# Patient Record
Sex: Female | Born: 1985 | Race: White | Hispanic: No | Marital: Married | Smoking: Never smoker
Health system: Southern US, Community
[De-identification: ages and names within clinical notes are randomized; demographics above are authoritative.]

## PROBLEM LIST (undated history)

## (undated) ENCOUNTER — Inpatient Hospital Stay (HOSPITAL_COMMUNITY): Payer: Self-pay

## (undated) DIAGNOSIS — D649 Anemia, unspecified: Secondary | ICD-10-CM

## (undated) HISTORY — PX: NO PAST SURGERIES: SHX2092

## (undated) HISTORY — PX: MYRINGOTOMY: SHX2060

---
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
## 2006-12-27 ENCOUNTER — Emergency Department (HOSPITAL_COMMUNITY): Admission: EM | Admit: 2006-12-27 | Discharge: 2006-12-27 | Payer: Self-pay | Admitting: Emergency Medicine

## 2007-01-15 ENCOUNTER — Emergency Department (HOSPITAL_COMMUNITY): Admission: EM | Admit: 2007-01-15 | Discharge: 2007-01-15 | Payer: Self-pay | Admitting: Family Medicine

## 2007-02-06 ENCOUNTER — Ambulatory Visit: Payer: Self-pay | Admitting: Internal Medicine

## 2007-02-10 ENCOUNTER — Ambulatory Visit (HOSPITAL_COMMUNITY): Admission: RE | Admit: 2007-02-10 | Discharge: 2007-02-10 | Payer: Self-pay | Admitting: Internal Medicine

## 2007-02-10 ENCOUNTER — Ambulatory Visit: Payer: Self-pay | Admitting: Internal Medicine

## 2007-02-18 ENCOUNTER — Emergency Department (HOSPITAL_COMMUNITY): Admission: EM | Admit: 2007-02-18 | Discharge: 2007-02-18 | Payer: Self-pay | Admitting: Emergency Medicine

## 2007-09-03 ENCOUNTER — Ambulatory Visit (HOSPITAL_COMMUNITY): Admission: RE | Admit: 2007-09-03 | Discharge: 2007-09-03 | Payer: Self-pay | Admitting: Obstetrics and Gynecology

## 2007-09-19 ENCOUNTER — Emergency Department (HOSPITAL_COMMUNITY): Admission: EM | Admit: 2007-09-19 | Discharge: 2007-09-20 | Payer: Self-pay | Admitting: Emergency Medicine

## 2010-04-09 ENCOUNTER — Emergency Department (HOSPITAL_COMMUNITY): Admission: EM | Admit: 2010-04-09 | Discharge: 2010-04-09 | Payer: Self-pay | Admitting: Family Medicine

## 2011-01-15 LAB — POCT URINALYSIS DIP (DEVICE)
Bilirubin Urine: NEGATIVE
Glucose, UA: NEGATIVE mg/dL
Hgb urine dipstick: NEGATIVE
Ketones, ur: NEGATIVE mg/dL
Nitrite: NEGATIVE
Protein, ur: NEGATIVE mg/dL
Specific Gravity, Urine: 1.01 (ref 1.005–1.030)
Urobilinogen, UA: 0.2 mg/dL (ref 0.0–1.0)
pH: 7 (ref 5.0–8.0)

## 2011-01-15 LAB — POCT PREGNANCY, URINE: Preg Test, Ur: NEGATIVE

## 2011-03-16 NOTE — Consult Note (Signed)
NAMEAERIKA, GROLL              ACCOUNT NO.:  [id number]   MEDICAL RECORD NO.:  [id number]          PATIENT TYPE:  AMB   LOCATION:  DAY                           FACILITY:  [facility]   PHYSICIAN:  Lionel December, M.D.    DATE OF BIRTH:  06-26-1986   DATE OF CONSULTATION:  02/06/2007  DATE OF DISCHARGE:                                 CONSULTATION   REFERRING PHYSICIAN:  Corrie Mckusick, M.D.   REASON FOR CONSULTATION:  Rectal bleeding.   HISTORY OF PRESENT ILLNESS:  [name] is a 25 year old, Caucasian  female.  Approximately 3 weeks ago, she developed an acute illness.  She  had nausea, vomiting and diarrhea which lasted about 3-1/2 hours.  She  then began to improve.  Approximately a week later, she began to have  bloody diarrhea.  She had anywhere between 5-10 loose, bloody stools.  She denies any mucus in her stools.  She has history of chronic  constipation, otherwise, and a significant amount of straining with her  stools.  She has been on MiraLax 17 g daily and has been doing much  better.  Prior to this, she would go 3-4 days without a bowel movement.  She has been given Anusol as well as ProctoFoam for her symptoms and she  has not noticed any further bleeding.  Lab work from Dr. Sharyon Medicus office  shows anemia with a hemoglobin of 10.4, hematocrit 35.8 and MCV of 70.1.  Back in March 2006, hemoglobin was up to 11 by May 2006, and she tells  me she denies any history of heavy menses and we do not have a recent  hemoglobin and hematocrit.   PAST MEDICAL HISTORY:  Bilateral myringotomy tubes.   CURRENT MEDICATIONS:  1. Yaz birth control pills.  2. Advil p.r.n. rarely.  3. MiraLax 17 g daily.   ALLERGIES:  NO KNOWN DRUG ALLERGIES.   FAMILY HISTORY:  No known family history of colorectal carcinoma, but  her father did develop adenomatous polyps in his late 70s.   SOCIAL HISTORY:  She is single.  She is a full-time business, Psychologist, prison and probation services at [employer].   She denies any tobacco, alcohol or  drug use.   REVIEW OF SYSTEMS:  CONSTITUTIONAL:  Weight is stable.  Denies any fever  or chills.  CARDIOVASCULAR:  Denies chest pain or palpitation, shortness  of breath, dyspnea, cough or hemoptysis.  GI:  See HPI.  Denies any  anorexia.  She has rare heartburn and indigestion.  Denies any dysphagia  or aphasia.  GYN:  Last menstrual period started today.  She has usual  28-day cycles.  Denies any menorrhagia.   PHYSICAL EXAMINATION:  VITAL SIGNS:  Weight 119 pounds, height 65  inches, temperature 97.7.  Blood pressure 100/78 and pulse 60.  GENERAL:  [name] is a well-developed, well-nourished, Caucasian  female in no acute distress.  She is accompanied by her mom today.  HEENT.  Sclerae clear.  Nonicteric and pink.  Oropharynx pink and moist  without any lesions.  NECK:  Supple without any mass or thyromegaly.  CHEST:  Regular rate and rhythm.  Normal S1, S2, without murmurs,  clicks, rubs or gallops.  LUNGS:  Clear to auscultation bilaterally.  ABDOMEN:  Positive bowel sounds x4.  No bruits auscultated.  Soft,  nontender, nondistended without palpable mass or hepatosplenomegaly.  No  rebound tenderness or guarding.  RECTAL:  No external lesions visualized.  Good sphincter tone  anteriorly.  She has an approximate 2 x 1-cm, raised palpable firm  lesion which is nontender.  A small amount of secretions were retained  obtained from the vault which were Hemoccult negative.  EXTREMITIES:  Without clubbing or edema bilaterally.  SKIN:  Pink, warm, dry without rash or jaundice.   IMPRESSION:  [name] is a 25 year old, Caucasian female with two  episodes of bloody diarrhea and abdominal cramping.  The first episode  was initially acute onset nausea, vomiting and diarrhea followed by  rectal bleeding.  Second episode was pretty much bloody diarrhea.  She  has history of iron deficiency anemia with last hemoglobin 11 in May  2006, per the  records we received.  On exam, she has a rectal lesion  which is going to require further evaluation with direct visualization.  She has a history of chronic constipation as well.  She is going to need  colonoscopy to rule out colorectal carcinoma or inflammatory bowel  disease.  It is possible she could have benign anorectal source for her  bleeding, but again rectal lesion is worrisome.   RECOMMENDATIONS:  1. Colonoscopy with Dr. Karilyn Cota in the near future.  I discussed the      procedure including risks and benefits including, but not limited      to bleeding, infection, perforation or drug reaction.  She agrees      and consent will be obtained.  2. Continue MiraLax 17 g daily for constipation.  3. Further recommendations to follow.   We would like to thank Dr. Phillips Odor for allowing [place] to participate in the  care of [name].      Nicholas Lose, N.P.      Lionel December, M.D.  Electronically Signed    KC/MEDQ  D:  02/06/2007  T:  02/06/2007  Job:  [id number]

## 2011-03-16 NOTE — Op Note (Signed)
Susan Lynn, Susan Lynn              ACCOUNT NO.:  [id number]   MEDICAL RECORD NO.:  [id number]          PATIENT TYPE:  AMB   LOCATION:  DAY                           FACILITY:  [facility]   PHYSICIAN:  Lionel December, M.D.    DATE OF BIRTH:  01/02/1986   DATE OF PROCEDURE:  02/10/2007  DATE OF DISCHARGE:                                PROCEDURE NOTE   PROCEDURE:  Colonoscopy with terminal ileoscopy.   ENDOSCOPIST:  Lionel December, M.D.   INDICATION:  Susan Lynn is a 25 year old Caucasian female who has chronic  constipation.  She recently suffered an episode of bloody diarrhea which  has cleared spontaneously.  She has mild iron-deficiency anemia felt to  be due to heavy periods; she has history of this.  She was seen in the  office last week by Ms. Nicholas Lose, F.N.P. and she felt a  lesion anteriorly.  She is undergoing diagnostic colonoscopy.  Procedure  risks were reviewed with the patient and informed consent was obtained.   MEDICATIONS FOR CONSCIOUS SEDATION:  Demerol 50 mg IV, Versed 8 mg IV in  divided dose.   FINDINGS:  Procedure performed in endoscopy suite.  The patient's vital  signs and O2 SATs were monitored during the procedure and remained  stable.  The patient was placed in the left lateral decubitus position  and rectal examination performed.  No abnormality noted on external or  digital exam, except her cervix was felt anteriorly, but there was no  mass.  It was felt easily through the anterior rectal wall, but there  was no mass.  Pentax videoscope was placed in the rectum.  Rectal mucosa  was normal.  Scope was retroflexed to examine anorectal junction, which  was unremarkable.  Endoscope was straightened and passed into sigmoid  colon and beyond.  There was somewhat redundant colon, but preparation  was excellent.  Scope was passed into cecum, which was identified by  appendiceal orifice and ileocecal valve.  A short segment of TI was also  examined and reveals  normal-appearing mucosa.  Pictures were taken for  the record.  As the scope was withdrawn, colonic mucosa was examined for  the second time and there were no mucosal abnormalities, polyps or  pseudomembranes noted.  Rectal mucosa was examined once again on the way  out and was normal.  The patient tolerated the procedure well.   FINAL DIAGNOSIS:  Normal colonoscopy and terminal ileoscopy.   I suspect she must have had a recent bout of self-limiting antral  colitis.   RECOMMENDATIONS:  She needs to be on a high-fiber diet plus fiber  supplement, 3-4 g daily.   Patient advised to take MiraLax daily or every other day and find that  loose stools that would allow her to have least three BMs a week.  The  patient was also advised to take ferrous sulfate 325 mg OTC 2 or 3 times  a week.  She should continue iron while she is having heavy periods.  She may have her H&H checked in the next few months when she follows up  with Dr.  Phillips Odor.      Lionel December, M.D.  Electronically Signed     NR/MEDQ  D:  02/10/2007  T:  02/11/2007  Job:  [id number]   cc:   Corrie Mckusick, M.D.  Fax: [phone]

## 2011-03-16 NOTE — Consult Note (Signed)
NAMEDAMIAH, MCDONALD              ACCOUNT NO.:  [id number]   MEDICAL RECORD NO.:  [id number]          PATIENT TYPE:  AMB   LOCATION:                                FACILITY:  [facility]   PHYSICIAN:  Lionel December, M.D.    DATE OF BIRTH:  [birth year]   DATE OF CONSULTATION:  02/07/2007  DATE OF DISCHARGE:                                 CONSULTATION   ADDENDUM   ADDENDUM:  Ms. Darrold Span was seen in the office yesterday. Just before 5  p.m., after her appointment yesterday, the nurse received a phone call  from her father stating that he had several questions.  I then contacted  Anamarie, the patient, and asked for her permission to discuss things with  her father.  She did give me verbal permission over the telephone.  I  then called her father who was in [place].  Basically, he stated that  her mother was quite distraught over the office visit today.  He said  that both she and Allysson are quite anxious.  He also tells me that she  felt as though I was throwing colon cancer up in her face (her  mother's).  Again I explained to him the fact that she does need  colonoscopy to further diagnose her symptoms.  I also explained to him  the limited utility of digital rectal exam in a situation like Kalis's  where she has had rectal bleeding, has a small lesion on exam, and has a  history of iron-deficiency anemia and has a family history of polyps.  I  stated it is not my intentions to cause any undue anxiety.  However, I  feel it is most appropriate for Ms. Livesay to undergo colonoscopy as  soon as possible to determine the etiology of her rectal bleeding.  Again, she had a small lesion palpated on digital rectal exam which  could be anything from a small internal hemorrhoid, polyp, or even  cancer but this is least likely.  Multiple questions were asked.  The  conversation lasted for about 15 minutes and I also stated that if the  patient herself or her mother have any further questions I would  be glad  to answer them.  She is scheduled for colonoscopy on Monday, which is  the next available time that Dr. Karilyn Cota is in the hospital.      Nicholas Lose, N.P.      Lionel December, M.D.  Electronically Signed    KC/MEDQ  D:  02/07/2007  T:  02/07/2007  Job:  [id number]

## 2011-04-02 ENCOUNTER — Inpatient Hospital Stay (INDEPENDENT_AMBULATORY_CARE_PROVIDER_SITE_OTHER)
Admission: RE | Admit: 2011-04-02 | Discharge: 2011-04-02 | Disposition: A | Discharge status: Home / Self Care | Payer: BC Managed Care – PPO | Source: Ambulatory Visit | Attending: Family Medicine | Admitting: Family Medicine

## 2011-04-02 DIAGNOSIS — H109 Unspecified conjunctivitis: Secondary | ICD-10-CM

## 2011-08-07 LAB — I-STAT 8, (EC8 V) (CONVERTED LAB)
BUN: 7
Bicarbonate: 20.4
Chloride: 111
pCO2, Ven: 33.1 — ABNORMAL LOW
pH, Ven: 7.399 — ABNORMAL HIGH

## 2011-08-07 LAB — POCT I-STAT CREATININE: Creatinine, Ser: 0.9

## 2011-08-07 LAB — RAPID URINE DRUG SCREEN, HOSP PERFORMED
Amphetamines: NOT DETECTED
Barbiturates: NOT DETECTED
Benzodiazepines: NOT DETECTED
Opiates: NOT DETECTED

## 2011-08-07 LAB — DIFFERENTIAL
Basophils Absolute: 0.1
Basophils Relative: 1
Eosinophils Absolute: 0 — ABNORMAL LOW
Monocytes Relative: 5
Neutrophils Relative %: 67

## 2011-08-07 LAB — CBC
MCHC: 34.1
MCV: 81.9
RBC: 5.08
RDW: 13

## 2014-05-26 LAB — OB RESULTS CONSOLE ABO/RH: RH Type: POSITIVE

## 2014-05-26 LAB — OB RESULTS CONSOLE RUBELLA ANTIBODY, IGM: RUBELLA: IMMUNE

## 2014-05-26 LAB — OB RESULTS CONSOLE GC/CHLAMYDIA
CHLAMYDIA, DNA PROBE: NEGATIVE
Gonorrhea: NEGATIVE

## 2014-05-26 LAB — OB RESULTS CONSOLE HEPATITIS B SURFACE ANTIGEN: Hepatitis B Surface Ag: NEGATIVE

## 2014-05-26 LAB — OB RESULTS CONSOLE RPR: RPR: NONREACTIVE

## 2014-05-26 LAB — OB RESULTS CONSOLE ANTIBODY SCREEN: ANTIBODY SCREEN: NEGATIVE

## 2014-05-26 LAB — OB RESULTS CONSOLE HIV ANTIBODY (ROUTINE TESTING): HIV: NONREACTIVE

## 2014-10-29 NOTE — L&D Delivery Note (Signed)
Delivery Note  SVD viable female Apgars 9,9 over 2nd deg ML lac.  Placenta delivered spontaneously intact with 3VC. Repair with 2-0 Chromic with good support and hemostasis noted and R/V exam confirms.  PH art was sent.  [place] cord blood was not done.  Mother and baby were doing well.  EBL 300cc  Candice Campavid Shanequia Kendrick, MD

## 2014-12-02 LAB — OB RESULTS CONSOLE GBS: STREP GROUP B AG: NEGATIVE

## 2014-12-13 ENCOUNTER — Encounter (HOSPITAL_COMMUNITY): Payer: Self-pay

## 2014-12-13 ENCOUNTER — Inpatient Hospital Stay (HOSPITAL_COMMUNITY)
Admission: AD | Admit: 2014-12-13 | Discharge: 2014-12-13 | Disposition: A | Discharge status: Home / Self Care | Payer: 59 | Source: Ambulatory Visit | Attending: Obstetrics and Gynecology | Admitting: Obstetrics and Gynecology

## 2014-12-13 DIAGNOSIS — O9989 Other specified diseases and conditions complicating pregnancy, childbirth and the puerperium: Secondary | ICD-10-CM | POA: Diagnosis present

## 2014-12-13 DIAGNOSIS — Z3A37 37 weeks gestation of pregnancy: Secondary | ICD-10-CM | POA: Diagnosis not present

## 2014-12-13 DIAGNOSIS — R109 Unspecified abdominal pain: Secondary | ICD-10-CM | POA: Diagnosis not present

## 2014-12-13 HISTORY — DX: Anemia, unspecified: D64.9

## 2014-12-13 LAB — AMNISURE RUPTURE OF MEMBRANE (ROM) NOT AT ARMC: AMNISURE: NEGATIVE

## 2014-12-13 NOTE — Discharge Instructions (Signed)

## 2014-12-13 NOTE — MAU Note (Addendum)
Pt C/O ? SROM, initial leakage of fluid last night about 2300.  Woke up @ 1100 with wet underwear, has continued to feel wet @ times during the night, is wet this a.m.  States she has some mild cramping, denies bleeding.  Last intercourse was last night prior to this leakage of fluid.  Pt reports good fetal movement.

## 2014-12-30 ENCOUNTER — Telehealth (HOSPITAL_COMMUNITY): Payer: Self-pay | Admitting: *Deleted

## 2014-12-30 NOTE — Telephone Encounter (Signed)
Preadmission screen  

## 2015-01-01 ENCOUNTER — Inpatient Hospital Stay (HOSPITAL_COMMUNITY): Payer: 59 | Admitting: Anesthesiology

## 2015-01-01 ENCOUNTER — Inpatient Hospital Stay (HOSPITAL_COMMUNITY)
Admission: RE | Admit: 2015-01-01 | Discharge: 2015-01-01 | Disposition: A | Discharge status: Home / Self Care | Payer: 59 | Source: Ambulatory Visit | Attending: Obstetrics and Gynecology | Admitting: Obstetrics and Gynecology

## 2015-01-01 ENCOUNTER — Encounter (HOSPITAL_COMMUNITY): Payer: Self-pay | Admitting: *Deleted

## 2015-01-01 ENCOUNTER — Inpatient Hospital Stay (HOSPITAL_COMMUNITY)
Admission: AD | Admit: 2015-01-01 | Discharge: 2015-01-03 | DRG: 775 | Disposition: A | Discharge status: Home / Self Care | Payer: 59 | Source: Ambulatory Visit | Attending: Obstetrics and Gynecology | Admitting: Obstetrics and Gynecology

## 2015-01-01 DIAGNOSIS — Z3490 Encounter for supervision of normal pregnancy, unspecified, unspecified trimester: Secondary | ICD-10-CM

## 2015-01-01 DIAGNOSIS — Z3A39 39 weeks gestation of pregnancy: Secondary | ICD-10-CM | POA: Diagnosis present

## 2015-01-01 LAB — TYPE AND SCREEN
ABO/RH(D): A POS
ANTIBODY SCREEN: NEGATIVE

## 2015-01-01 LAB — ABO/RH: ABO/RH(D): A POS

## 2015-01-01 LAB — CBC
HCT: 39.2 % (ref 36.0–46.0)
Hemoglobin: 13.3 g/dL (ref 12.0–15.0)
MCH: 30 pg (ref 26.0–34.0)
MCHC: 33.9 g/dL (ref 30.0–36.0)
MCV: 88.3 fL (ref 78.0–100.0)
PLATELETS: 123 10*3/uL — AB (ref 150–400)
RBC: 4.44 MIL/uL (ref 3.87–5.11)
RDW: 14.2 % (ref 11.5–15.5)
WBC: 9.5 10*3/uL (ref 4.0–10.5)

## 2015-01-01 MED ORDER — OXYTOCIN 40 UNITS IN LACTATED RINGERS INFUSION - SIMPLE MED: 62.5000 mL/h | INTRAVENOUS | Status: DC

## 2015-01-01 MED ORDER — PHENYLEPHRINE 40 MCG/ML (10ML) SYRINGE FOR IV PUSH (FOR BLOOD PRESSURE SUPPORT)
80.0000 ug | PREFILLED_SYRINGE | INTRAVENOUS | Status: DC | PRN
  Filled 2015-01-01: qty 2

## 2015-01-01 MED ORDER — EPHEDRINE 5 MG/ML INJ
10.0000 mg | INTRAVENOUS | Status: DC | PRN
  Filled 2015-01-01: qty 2

## 2015-01-01 MED ORDER — ZOLPIDEM TARTRATE 5 MG PO TABS: 5.0000 mg | ORAL_TABLET | Freq: Every evening | ORAL | Status: DC | PRN

## 2015-01-01 MED ORDER — ONDANSETRON HCL 4 MG/2ML IJ SOLN: 4.0000 mg | Freq: Four times a day (QID) | INTRAMUSCULAR | Status: DC | PRN

## 2015-01-01 MED ORDER — BENZOCAINE-MENTHOL 20-0.5 % EX AERO
1.0000 "application " | INHALATION_SPRAY | CUTANEOUS | Status: DC | PRN
  Administered 2015-01-01: 1 via TOPICAL
  Filled 2015-01-01: qty 56

## 2015-01-01 MED ORDER — SALINE SPRAY 0.65 % NA SOLN
1.0000 | NASAL | Status: DC | PRN
  Filled 2015-01-01: qty 44

## 2015-01-01 MED ORDER — TETANUS-DIPHTH-ACELL PERTUSSIS 5-2.5-18.5 LF-MCG/0.5 IM SUSP: 0.5000 mL | Freq: Once | INTRAMUSCULAR | Status: DC

## 2015-01-01 MED ORDER — FENTANYL 2.5 MCG/ML BUPIVACAINE 1/10 % EPIDURAL INFUSION (WH - ANES)
INTRAMUSCULAR | Status: AC
  Filled 2015-01-01: qty 125

## 2015-01-01 MED ORDER — OXYCODONE-ACETAMINOPHEN 5-325 MG PO TABS: 2.0000 | ORAL_TABLET | ORAL | Status: DC | PRN

## 2015-01-01 MED ORDER — DIBUCAINE 1 % RE OINT: 1.0000 "application " | TOPICAL_OINTMENT | RECTAL | Status: DC | PRN

## 2015-01-01 MED ORDER — MEDROXYPROGESTERONE ACETATE 150 MG/ML IM SUSP: 150.0000 mg | INTRAMUSCULAR | Status: DC | PRN

## 2015-01-01 MED ORDER — OXYMETAZOLINE HCL 0.05 % NA SOLN
1.0000 | Freq: Two times a day (BID) | NASAL | Status: DC
  Filled 2015-01-01: qty 15

## 2015-01-01 MED ORDER — FENTANYL 2.5 MCG/ML BUPIVACAINE 1/10 % EPIDURAL INFUSION (WH - ANES)
14.0000 mL/h | INTRAMUSCULAR | Status: DC | PRN
  Administered 2015-01-01: 14 mL/h via EPIDURAL

## 2015-01-01 MED ORDER — CITRIC ACID-SODIUM CITRATE 334-500 MG/5ML PO SOLN: 30.0000 mL | ORAL | Status: DC | PRN

## 2015-01-01 MED ORDER — WITCH HAZEL-GLYCERIN EX PADS: 1.0000 "application " | MEDICATED_PAD | CUTANEOUS | Status: DC | PRN

## 2015-01-01 MED ORDER — LIDOCAINE HCL (PF) 1 % IJ SOLN
30.0000 mL | INTRAMUSCULAR | Status: DC | PRN
  Filled 2015-01-01: qty 30

## 2015-01-01 MED ORDER — OXYCODONE-ACETAMINOPHEN 5-325 MG PO TABS: 1.0000 | ORAL_TABLET | ORAL | Status: DC | PRN

## 2015-01-01 MED ORDER — PRENATAL MULTIVITAMIN CH
1.0000 | ORAL_TABLET | Freq: Every day | ORAL | Status: DC
  Filled 2015-01-01 (×2): qty 1

## 2015-01-01 MED ORDER — FENTANYL 2.5 MCG/ML BUPIVACAINE 1/10 % EPIDURAL INFUSION (WH - ANES): 12.0000 mL/h | INTRAMUSCULAR | Status: DC | PRN

## 2015-01-01 MED ORDER — LIDOCAINE HCL (PF) 1 % IJ SOLN
30.0000 mL | INTRAMUSCULAR | Status: AC | PRN
  Administered 2015-01-01 (×2): 5 mL via SUBCUTANEOUS

## 2015-01-01 MED ORDER — OXYTOCIN 40 UNITS IN LACTATED RINGERS INFUSION - SIMPLE MED
1.0000 m[IU]/min | INTRAVENOUS | Status: DC
  Administered 2015-01-01: 2 m[IU]/min via INTRAVENOUS
  Filled 2015-01-01: qty 1000

## 2015-01-01 MED ORDER — PHENYLEPHRINE 40 MCG/ML (10ML) SYRINGE FOR IV PUSH (FOR BLOOD PRESSURE SUPPORT)
PREFILLED_SYRINGE | INTRAVENOUS | Status: AC
  Filled 2015-01-01: qty 20

## 2015-01-01 MED ORDER — MEASLES, MUMPS & RUBELLA VAC ~~LOC~~ INJ
0.5000 mL | INJECTION | Freq: Once | SUBCUTANEOUS | Status: DC
  Filled 2015-01-01: qty 0.5

## 2015-01-01 MED ORDER — OXYTOCIN BOLUS FROM INFUSION
500.0000 mL | INTRAVENOUS | Status: DC
  Administered 2015-01-01: 500 mL via INTRAVENOUS

## 2015-01-01 MED ORDER — LACTATED RINGERS IV SOLN
INTRAVENOUS | Status: DC
Start: 2015-01-01 — End: 2015-01-01
  Administered 2015-01-01: 1000 mL via INTRAVENOUS
  Administered 2015-01-01: 15:00:00 via INTRAVENOUS

## 2015-01-01 MED ORDER — IBUPROFEN 600 MG PO TABS
600.0000 mg | ORAL_TABLET | Freq: Four times a day (QID) | ORAL | Status: DC
  Administered 2015-01-02 – 2015-01-03 (×7): 600 mg via ORAL
  Filled 2015-01-01 (×7): qty 1

## 2015-01-01 MED ORDER — DIPHENHYDRAMINE HCL 50 MG/ML IJ SOLN: 12.5000 mg | INTRAMUSCULAR | Status: DC | PRN

## 2015-01-01 MED ORDER — TERBUTALINE SULFATE 1 MG/ML IJ SOLN
0.2500 mg | Freq: Once | INTRAMUSCULAR | Status: DC | PRN
  Filled 2015-01-01: qty 1

## 2015-01-01 MED ORDER — SIMETHICONE 80 MG PO CHEW: 80.0000 mg | CHEWABLE_TABLET | ORAL | Status: DC | PRN

## 2015-01-01 MED ORDER — LACTATED RINGERS IV SOLN: 500.0000 mL | INTRAVENOUS | Status: DC | PRN

## 2015-01-01 MED ORDER — DIPHENHYDRAMINE HCL 25 MG PO CAPS: 25.0000 mg | ORAL_CAPSULE | Freq: Four times a day (QID) | ORAL | Status: DC | PRN

## 2015-01-01 MED ORDER — LANOLIN HYDROUS EX OINT: TOPICAL_OINTMENT | CUTANEOUS | Status: DC | PRN

## 2015-01-01 MED ORDER — LIDOCAINE HCL (PF) 1 % IJ SOLN
INTRAMUSCULAR | Status: AC
  Filled 2015-01-01: qty 30

## 2015-01-01 MED ORDER — ONDANSETRON HCL 4 MG/2ML IJ SOLN: 4.0000 mg | INTRAMUSCULAR | Status: DC | PRN

## 2015-01-01 MED ORDER — LACTATED RINGERS IV SOLN
500.0000 mL | Freq: Once | INTRAVENOUS | Status: AC
  Administered 2015-01-01: 500 mL via INTRAVENOUS

## 2015-01-01 MED ORDER — FLEET ENEMA 7-19 GM/118ML RE ENEM: 1.0000 | ENEMA | RECTAL | Status: DC | PRN

## 2015-01-01 MED ORDER — ACETAMINOPHEN 325 MG PO TABS: 650.0000 mg | ORAL_TABLET | ORAL | Status: DC | PRN

## 2015-01-01 MED ORDER — ONDANSETRON HCL 4 MG PO TABS: 4.0000 mg | ORAL_TABLET | ORAL | Status: DC | PRN

## 2015-01-01 MED ORDER — SENNOSIDES-DOCUSATE SODIUM 8.6-50 MG PO TABS
2.0000 | ORAL_TABLET | ORAL | Status: DC
  Administered 2015-01-02 – 2015-01-03 (×2): 2 via ORAL
  Filled 2015-01-01 (×2): qty 2

## 2015-01-01 NOTE — Progress Notes (Signed)
efm d/c'd for epidural  

## 2015-01-01 NOTE — Lactation Note (Signed)
This note was copied from the chart of Susan Percell LocusAshlee Sinkfield. Lactation Consultation Note Initial visit at 4 hours of age per request of MBU RN, pt needs latch assist.  Mom reports baby has had a few latches but with only a few sucks and some have been shallow with nipple pain.  Assisted with position for cross cradle.  Mom is able to easily express copious colostrum.  Baby opens wide and has a very strong initial suction and then stops and pulls off fussy.  Repeated several times and then baby began a rhythmic sucking with wide flanged lips.  Mom has short nipples and dense breast tissue compressible with effort.  FOB at bedside supportive.  Baby noted to have several swallows during a few minutes of feeding.  Baby remains latched [facility] visit. [facility] given and discussed.  Encouraged to feed with early cues on demand.  Early newborn behavior discussed.  Mom to call for assist as needed.    Patient Name: Susan Lynn JXBJY'NToday's Date: 01/01/2015 Reason for consult: Initial assessment   Maternal Data Formula Feeding for Exclusion: No Has patient been taught Hand Expression?: Yes Does the patient have breastfeeding experience prior to this delivery?: No  Feeding Feeding Type: Breast Fed Length of feed:  (several minutes observed)  LATCH Score/Interventions Latch: Repeated attempts needed to sustain latch, nipple held in mouth throughout feeding, stimulation needed to elicit sucking reflex. Intervention(s): Skin to skin;Teach feeding cues;Waking techniques Intervention(s): Breast compression;Breast massage  Audible Swallowing: A few with stimulation Intervention(s): Skin to skin;Hand expression  Type of Nipple: Everted at rest and after stimulation  Comfort (Breast/Nipple): Soft / non-tender     Hold (Positioning): Assistance needed to correctly position infant at breast and maintain latch. Intervention(s): Skin to skin;Position options;Support Pillows;Breastfeeding basics  reviewed  LATCH Score: 7  Lactation Tools Discussed/Used     Consult Status Consult Status: Follow-up Date: 01/02/15 Follow-up type: In-patient    [name], [name] 01/01/2015, 11:20 PM

## 2015-01-01 NOTE — Anesthesia Procedure Notes (Signed)
Epidural Patient location during procedure: OB Start time: 01/01/2015 2:40 PM End time: 01/01/2015 3:58 PM  Staffing Anesthesiologist: Rosario JacksPOTISEK, Vermelle Cammarata GUFFEY Performed by: anesthesiologist   Preanesthetic Checklist Completed: patient identified, site marked, surgical consent, pre-op evaluation, timeout performed, IV checked, risks and benefits discussed and monitors and equipment checked  Epidural Patient position: sitting Prep: site prepped and draped and DuraPrep Patient monitoring: continuous pulse ox and blood pressure Approach: midline Location: L3-L4 Injection technique: LOR air  Needle:  Needle type: Tuohy  Needle gauge: 17 G Needle length: 9 cm and 9 Needle insertion depth: 5 cm cm Catheter type: closed end flexible Catheter size: 19 Gauge Catheter at skin depth: 10 cm Test dose: negative  Assessment Events: blood not aspirated, injection not painful, no injection resistance, negative IV test and no paresthesia  Additional Notes Informed consent obtained prior to proceeding including risk of failure, 1% risk of PDPH, risk of minor discomfort and bruising.  Discussed rare but serious complications including epidural abscess, permanent nerve injury, epidural hematoma.  Discussed alternatives to epidural analgesia and patient desires to proceed.  Timeout performed pre-procedure verifying patient name, procedure, and platelet count.  Patient tolerated procedure well.  Catheter secured at 10 at the skin.  SA test negative as confirmed by no motor block of hip abduction at 5 min post injection of 50 mg of lidocaine into the epidural catheter.  Bupivacaine 0.1% with fentanyl 2.75mcg/ml infused post procedure at 2712ml/hr with PCEA of 9ml every 10 mins.

## 2015-01-01 NOTE — H&P (Signed)
Susan Lynn is a 29 y.o. female presenting for IOL due to social reasons (husband to be out of town due to business) and favorable cervix.  Pregnancy uncomplicated.  GBS-. History OB History    Gravida Para Term Preterm AB TAB SAB Ectopic Multiple Living   1         0     Past Medical History  Diagnosis Date  . Anemia    Past Surgical History  Procedure Laterality Date  . No past surgeries     Family History: family history is not on file. Social History:  reports that she has never smoked. She does not have any smokeless tobacco history on file. She reports that she does not drink alcohol or use illicit drugs.   Prenatal Transfer Tool  Maternal Diabetes: No Genetic Screening: Normal Maternal Ultrasounds/Referrals: Normal Fetal Ultrasounds or other Referrals:  None Maternal Substance Abuse:  No Significant Maternal Medications:  None Significant Maternal Lab Results:  None Other Comments:  None  ROS  Dilation: 2.5 Effacement (%): 80 Station: -2 Exam by:: Dr Rana SnareLowe Blood pressure 136/72, pulse 92, height 5\' 5"  (1.651 m), weight 159 lb (72.122 kg). Exam Physical Exam  Prenatal labs: ABO, Rh: A/Positive/-- (07/29 0000) Antibody: Negative (07/29 0000) Rubella: Immune (07/29 0000) RPR: Nonreactive (07/29 0000)  HBsAg: Negative (07/29 0000)  HIV: Non-reactive (07/29 0000)  GBS: Negative (02/04 0000)   Assessment/Plan: IUP at term Favorable cervix.  AROM and pitocin Anticipate SVD   Susan Lynn 01/01/2015, 8:44 AM

## 2015-01-01 NOTE — Anesthesia Preprocedure Evaluation (Addendum)
Anesthesia Evaluation  Patient identified by MRN, date of birth, ID band Patient awake    Reviewed: Allergy & Precautions, NPO status , Patient's Chart, lab work & pertinent test results  History of Anesthesia Complications Negative for: history of anesthetic complications  Airway Mallampati: II  TM Distance: >3 FB Neck ROM: Full    Dental no notable dental hx.    Pulmonary neg pulmonary ROS, neg sleep apnea,  breath sounds clear to auscultation  Pulmonary exam normal       Cardiovascular negative cardio ROS  Rhythm:Regular Rate:Normal     Neuro/Psych negative neurological ROS  negative psych ROS   GI/Hepatic negative GI ROS, Neg liver ROS, GERD-  Medicated and Controlled,  Endo/Other  negative endocrine ROS  Renal/GU negative Renal ROS  negative genitourinary   Musculoskeletal negative musculoskeletal ROS (+)   Abdominal Normal abdominal exam  (+)   Peds negative pediatric ROS (+)  Hematology negative hematology ROS (+)   Anesthesia Other Findings   Reproductive/Obstetrics negative OB ROS                            Anesthesia Physical Anesthesia Plan  ASA: II  Anesthesia Plan: Epidural   Post-op Pain Management:    Induction:   Airway Management Planned:   Additional Equipment:   Intra-op Plan:   Post-operative Plan:   Informed Consent:   Plan Discussed with:   Anesthesia Plan Comments: [age] yr old G1 at 3239 weeks and 2 days for EIOL. Desires epidural.  Platelets 125 today.  NO history of abnormal bleeding.  Does have vague history of local anesthetics "not working."    )       Anesthesia Quick Evaluation

## 2015-01-02 LAB — CBC
HCT: 36.1 % (ref 36.0–46.0)
Hemoglobin: 12.2 g/dL (ref 12.0–15.0)
MCH: 29.8 pg (ref 26.0–34.0)
MCHC: 33.8 g/dL (ref 30.0–36.0)
MCV: 88.3 fL (ref 78.0–100.0)
Platelets: 132 10*3/uL — ABNORMAL LOW (ref 150–400)
RBC: 4.09 MIL/uL (ref 3.87–5.11)
RDW: 14.1 % (ref 11.5–15.5)
WBC: 13.5 10*3/uL — ABNORMAL HIGH (ref 4.0–10.5)

## 2015-01-02 LAB — RPR: RPR: NONREACTIVE

## 2015-01-02 LAB — HIV ANTIBODY (ROUTINE TESTING W REFLEX): HIV Screen 4th Generation wRfx: NONREACTIVE

## 2015-01-02 NOTE — Anesthesia Postprocedure Evaluation (Signed)
  Anesthesia Post-op Note  Patient: Susan Lynn  Procedure(s) Performed: * No procedures listed *  Patient Location: Mother/Baby  Anesthesia Type:Epidural  Level of Consciousness: awake and alert   Airway and Oxygen Therapy: Patient Spontanous Breathing  Post-op Pain: mild  Post-op Assessment: Post-op Vital signs reviewed, Patient's Cardiovascular Status Stable, Respiratory Function Stable, No signs of Nausea or vomiting, Pain level controlled, No headache, No residual numbness and No residual motor weakness  Post-op Vital Signs: Reviewed  Last Vitals:  Filed Vitals:   01/02/15 0200  BP: 125/52  Pulse: 67  Temp: 36.8 C  Resp: 18    Complications: No apparent anesthesia complications

## 2015-01-02 NOTE — Progress Notes (Signed)
Post Partum Day 1 Subjective: no complaints, up ad lib, voiding and tolerating PO  Objective: Blood pressure 125/52, pulse 67, temperature 98.2 F (36.8 C), temperature source Oral, resp. rate 18, height 5\' 5"  (1.651 m), weight 159 lb (72.122 kg), SpO2 98 %, unknown if currently breastfeeding.  Physical Exam:  General: alert, cooperative, appears stated age and no distress Lochia: appropriate Uterine Fundus: firm Incision: healing well DVT Evaluation: No evidence of DVT seen on physical exam.   Recent Labs  01/01/15 0740 01/02/15 0611  HGB 13.3 12.2  HCT 39.2 36.1    Assessment/Plan: Plan for discharge tomorrow and Breastfeeding   LOS: 1 day   Arsh Feutz C 01/02/2015, 9:44 AM

## 2015-01-02 NOTE — Progress Notes (Signed)

## 2015-01-02 NOTE — Lactation Note (Signed)
This note was copied from the chart of Susan Percell LocusAshlee Popwell. Lactation Consultation Note  Patient Name: Susan Lynn WUJWJ'XToday's Date: 01/02/2015 Reason for consult: Follow-up assessment;Breast/nipple pain Mom's left nipple is bruised/aerola is bruised, right nipple is cracked/bruised. Attempted to assist Mom with latching baby, severe to moderate discomfort reported. Baby had difficulty sustaining good depth with latch. Initiated #24 nipple shield, baby latched well developing wide flanged lips, good suckling rhythm with some audible swallows. Mom reports much less discomfort. Care for sore nipples reviewed. Mom has comfort gels. Mom has own DEBP and advised to post pump for 15 minutes after feeding 4-6 times per day starting tomorrow to encourage milk production. Advised to look for colostrum in the nipple shield with feedings, scant amount observed with #24 nipple shield, Mom will try #20 with next feeding. Advised baby should be at the breast 8-12 times in 24 hours, cluster feeding reviewed. Encouraged to call for assist as needed.   Maternal Data    Feeding Feeding Type: Breast Fed  LATCH Score/Interventions Latch: Grasps breast easily, tongue down, lips flanged, rhythmical sucking. (using #24 nipple shield) Intervention(s): Adjust position;Assist with latch;Breast massage;Breast compression  Audible Swallowing: A few with stimulation  Type of Nipple: Everted at rest and after stimulation (nipples have short shaft, aerola edema)  Comfort (Breast/Nipple): Filling, red/small blisters or bruises, mild/mod discomfort  Problem noted: Cracked, bleeding, blisters, bruises;Mild/Moderate discomfort Interventions  (Cracked/bleeding/bruising/blister): Expressed breast milk to nipple Interventions (Mild/moderate discomfort): Comfort gels  Hold (Positioning): Assistance needed to correctly position infant at breast and maintain latch. Intervention(s): Breastfeeding basics reviewed;Support  Pillows;Position options;Skin to skin  LATCH Score: 7  Lactation Tools Discussed/Used Tools: Nipple Shields;Comfort gels Nipple shield size: 24   Consult Status Consult Status: Follow-up Date: 01/03/15 Follow-up type: In-patient    Susan Lynn, Susan Lynn 01/02/2015, 9:43 PM

## 2015-01-03 NOTE — Discharge Summary (Signed)
Obstetric Discharge Summary Reason for Admission: induction of labor Prenatal Procedures: ultrasound Intrapartum Procedures: spontaneous vaginal delivery Postpartum Procedures: none Complications-Operative and Postpartum: 2 degree perineal laceration HEMOGLOBIN  Date Value Ref Range Status  01/02/2015 12.2 12.0 - 15.0 g/dL Final   HCT  Date Value Ref Range Status  01/02/2015 36.1 36.0 - 46.0 % Final    Physical Exam:  General: alert and cooperative Lochia: appropriate Uterine Fundus: firm Incision: healing well DVT Evaluation: No evidence of DVT seen on physical exam. Negative Homan's sign. No cords or calf tenderness. No significant calf/ankle edema.  Discharge Diagnoses: Term Pregnancy-delivered  Discharge Information: Date: 01/03/2015 Activity: pelvic rest Diet: routine Medications: PNV and Ibuprofen Condition: stable Instructions: refer to practice specific booklet Discharge to: home   Newborn Data: Live born female  Birth Weight: 7 lb 11.1 oz (3490 g) APGAR: 9, 9  Home with mother.  CURTIS,CAROL G 01/03/2015, 8:27 AM

## 2015-01-03 NOTE — Lactation Note (Addendum)
This note was copied from the chart of Susan Percell LocusAshlee Vanvorst. Lactation Consultation Note  Mom is feeling more comfortable with feedings.  Extra comfort gels given to mom related to outpatient appointment being in one week. Breasts are filling.  [name] is using the nipple shield to latch onto and mom reports it is better.  She needs occasional stimulation to continue suckling.  Recommended breast compression through out the feedings.  Post pumping planned.  A Foley cup was given to the mom and its use explained in the event that the baby needs supplementationOutpatient appointment scheduled.  Aware of support groups and outpatient services.  Patient Name: Susan Lynn BJYNW'GToday's Date: 01/03/2015     Maternal Data    Feeding Feeding Type: Breast Fed Length of feed: 20 min  LATCH Score/Interventions Latch: Grasps breast easily, tongue down, lips flanged, rhythmical sucking. Intervention(s): Skin to skin  Audible Swallowing: A few with stimulation  Type of Nipple: Everted at rest and after stimulation  Comfort (Breast/Nipple): Filling, red/small blisters or bruises, mild/mod discomfort  Problem noted: Cracked, bleeding, blisters, bruises Interventions  (Cracked/bleeding/bruising/blister):  (comfort gels, hand expressed breastmilk ro nipples) Interventions (Mild/moderate discomfort): Comfort gels  Hold (Positioning): Assistance needed to correctly position infant at breast and maintain latch. Intervention(s): Support Pillows  LATCH Score: 7  Lactation Tools Discussed/Used     Consult Status      Susan Lynn, Susan Lynn 01/03/2015, 9:10 AM

## 2015-01-10 ENCOUNTER — Ambulatory Visit (HOSPITAL_COMMUNITY): Admit: 2015-01-10 | Payer: 59

## 2016-10-04 ENCOUNTER — Other Ambulatory Visit: Payer: Self-pay | Admitting: Obstetrics and Gynecology

## 2016-10-04 DIAGNOSIS — N644 Mastodynia: Secondary | ICD-10-CM

## 2016-10-17 ENCOUNTER — Ambulatory Visit
Admission: RE | Admit: 2016-10-17 | Discharge: 2016-10-17 | Disposition: A | Discharge status: Home / Self Care | Payer: BLUE CROSS/BLUE SHIELD | Source: Ambulatory Visit | Attending: Obstetrics and Gynecology | Admitting: Obstetrics and Gynecology

## 2016-10-17 DIAGNOSIS — N644 Mastodynia: Secondary | ICD-10-CM

## 2016-11-30 ENCOUNTER — Encounter: Payer: Self-pay | Admitting: Internal Medicine

## 2017-05-27 LAB — OB RESULTS CONSOLE RUBELLA ANTIBODY, IGM: Rubella: IMMUNE

## 2017-05-27 LAB — OB RESULTS CONSOLE ABO/RH: RH Type: POSITIVE

## 2017-05-27 LAB — OB RESULTS CONSOLE GC/CHLAMYDIA
Chlamydia: NEGATIVE
GC PROBE AMP, GENITAL: NEGATIVE

## 2017-05-27 LAB — OB RESULTS CONSOLE RPR: RPR: NONREACTIVE

## 2017-05-27 LAB — OB RESULTS CONSOLE HEPATITIS B SURFACE ANTIGEN: Hepatitis B Surface Ag: NEGATIVE

## 2017-05-27 LAB — OB RESULTS CONSOLE HIV ANTIBODY (ROUTINE TESTING): HIV: NONREACTIVE

## 2017-05-27 LAB — OB RESULTS CONSOLE ANTIBODY SCREEN: Antibody Screen: NEGATIVE

## 2017-10-29 NOTE — L&D Delivery Note (Signed)
Operative Delivery Note At 5:24 AM a viable female was delivered via Vaginal, Vacuum Investment banker, operational(Extractor).  Presentation: vertex; Position: ROT; Station: +3.  Verbal consent: obtained from patient.  Risks and benefits discussed in detail.  Risks include, but are not limited to the risks of anesthesia, bleeding, infection, damage to maternal tissues, fetal cephalhematoma.  There is also the risk of inability to effect vaginal delivery of the head, or shoulder dystocia that cannot be resolved by established maneuvers, leading to the need for emergency cesarean section.  APGAR: 8, 9; weight  .   Placenta status: spontaneously with 3 vessel cord , .   Cord:  with the following complications:none .  Cord pH: not obtained  Anesthesia:  epidural Instruments: mushroom Episiotomy: None Lacerations:  2nd Suture Repair: 2.0 3.0 chromic vicryl Est. Blood Loss (mL):  300  Mom to postpartum.  Baby to Couplet care / Skin to Skin.  Ahana Najera L 01/07/2018, 5:41 AM

## 2017-12-02 LAB — OB RESULTS CONSOLE GBS: GBS: NEGATIVE

## 2018-01-01 ENCOUNTER — Encounter (HOSPITAL_COMMUNITY): Payer: Self-pay | Admitting: *Deleted

## 2018-01-01 ENCOUNTER — Telehealth (HOSPITAL_COMMUNITY): Payer: Self-pay | Admitting: *Deleted

## 2018-01-01 NOTE — Telephone Encounter (Signed)
Preadmission screen  

## 2018-01-06 ENCOUNTER — Inpatient Hospital Stay (HOSPITAL_COMMUNITY)
Admission: AD | Admit: 2018-01-06 | Discharge: 2018-01-08 | DRG: 807 | Disposition: A | Discharge status: Home / Self Care | Payer: BLUE CROSS/BLUE SHIELD | Source: Ambulatory Visit | Attending: Obstetrics and Gynecology | Admitting: Obstetrics and Gynecology

## 2018-01-06 ENCOUNTER — Encounter (HOSPITAL_COMMUNITY): Payer: Self-pay

## 2018-01-06 DIAGNOSIS — Z3A40 40 weeks gestation of pregnancy: Secondary | ICD-10-CM | POA: Diagnosis not present

## 2018-01-06 DIAGNOSIS — Z3483 Encounter for supervision of other normal pregnancy, third trimester: Secondary | ICD-10-CM | POA: Diagnosis present

## 2018-01-06 LAB — CBC
HEMATOCRIT: 40.4 % (ref 36.0–46.0)
HEMOGLOBIN: 13.9 g/dL (ref 12.0–15.0)
MCH: 30.7 pg (ref 26.0–34.0)
MCHC: 34.4 g/dL (ref 30.0–36.0)
MCV: 89.2 fL (ref 78.0–100.0)
Platelets: 128 10*3/uL — ABNORMAL LOW (ref 150–400)
RBC: 4.53 MIL/uL (ref 3.87–5.11)
RDW: 14.3 % (ref 11.5–15.5)
WBC: 10.1 10*3/uL (ref 4.0–10.5)

## 2018-01-06 LAB — TYPE AND SCREEN
ABO/RH(D): A POS
ANTIBODY SCREEN: NEGATIVE

## 2018-01-06 MED ORDER — LACTATED RINGERS IV SOLN: 500.0000 mL | INTRAVENOUS | Status: DC | PRN

## 2018-01-06 MED ORDER — LACTATED RINGERS IV SOLN
INTRAVENOUS | Status: DC
  Administered 2018-01-06: 23:00:00 via INTRAVENOUS

## 2018-01-06 MED ORDER — FLEET ENEMA 7-19 GM/118ML RE ENEM: 1.0000 | ENEMA | RECTAL | Status: DC | PRN

## 2018-01-06 MED ORDER — OXYCODONE-ACETAMINOPHEN 5-325 MG PO TABS: 2.0000 | ORAL_TABLET | ORAL | Status: DC | PRN

## 2018-01-06 MED ORDER — ONDANSETRON HCL 4 MG/2ML IJ SOLN: 4.0000 mg | Freq: Four times a day (QID) | INTRAMUSCULAR | Status: DC | PRN

## 2018-01-06 MED ORDER — OXYTOCIN BOLUS FROM INFUSION
500.0000 mL | Freq: Once | INTRAVENOUS | Status: AC
  Administered 2018-01-07: 500 mL via INTRAVENOUS

## 2018-01-06 MED ORDER — ACETAMINOPHEN 325 MG PO TABS: 650.0000 mg | ORAL_TABLET | ORAL | Status: DC | PRN

## 2018-01-06 MED ORDER — OXYCODONE-ACETAMINOPHEN 5-325 MG PO TABS: 1.0000 | ORAL_TABLET | ORAL | Status: DC | PRN

## 2018-01-06 MED ORDER — OXYTOCIN 40 UNITS IN LACTATED RINGERS INFUSION - SIMPLE MED
2.5000 [IU]/h | INTRAVENOUS | Status: DC
  Administered 2018-01-07: 2.5 [IU]/h via INTRAVENOUS
  Filled 2018-01-06: qty 1000

## 2018-01-06 MED ORDER — LIDOCAINE HCL (PF) 1 % IJ SOLN
30.0000 mL | INTRAMUSCULAR | Status: DC | PRN
  Filled 2018-01-06: qty 30

## 2018-01-06 MED ORDER — SOD CITRATE-CITRIC ACID 500-334 MG/5ML PO SOLN: 30.0000 mL | ORAL | Status: DC | PRN

## 2018-01-06 NOTE — MAU Note (Signed)
Pt reports contractions started after appt today around 3pm. Pt reports feeling contractions every couple of mins. Feels more in back. Pt denies LOF. Reports some mucousy brown discharge. Had a membrane sweep today and was almost 4cm. Pt reports good fetal movement.

## 2018-01-07 ENCOUNTER — Inpatient Hospital Stay (HOSPITAL_COMMUNITY): Payer: BLUE CROSS/BLUE SHIELD | Admitting: Anesthesiology

## 2018-01-07 ENCOUNTER — Encounter (HOSPITAL_COMMUNITY): Payer: Self-pay | Admitting: Obstetrics and Gynecology

## 2018-01-07 ENCOUNTER — Inpatient Hospital Stay (HOSPITAL_COMMUNITY)
Admission: RE | Admit: 2018-01-07 | Payer: BLUE CROSS/BLUE SHIELD | Source: Ambulatory Visit | Admitting: Obstetrics and Gynecology

## 2018-01-07 LAB — RPR: RPR Ser Ql: NONREACTIVE

## 2018-01-07 MED ORDER — SENNOSIDES-DOCUSATE SODIUM 8.6-50 MG PO TABS
2.0000 | ORAL_TABLET | ORAL | Status: DC
  Administered 2018-01-08: 2 via ORAL
  Filled 2018-01-07: qty 2

## 2018-01-07 MED ORDER — PHENYLEPHRINE 40 MCG/ML (10ML) SYRINGE FOR IV PUSH (FOR BLOOD PRESSURE SUPPORT): 80.0000 ug | PREFILLED_SYRINGE | INTRAVENOUS | Status: DC | PRN

## 2018-01-07 MED ORDER — MEASLES, MUMPS & RUBELLA VAC ~~LOC~~ INJ
0.5000 mL | INJECTION | Freq: Once | SUBCUTANEOUS | Status: DC
  Filled 2018-01-07: qty 0.5

## 2018-01-07 MED ORDER — COCONUT OIL OIL: 1.0000 "application " | TOPICAL_OIL | Status: DC | PRN

## 2018-01-07 MED ORDER — TETANUS-DIPHTH-ACELL PERTUSSIS 5-2.5-18.5 LF-MCG/0.5 IM SUSP: 0.5000 mL | Freq: Once | INTRAMUSCULAR | Status: DC

## 2018-01-07 MED ORDER — SIMETHICONE 80 MG PO CHEW: 80.0000 mg | CHEWABLE_TABLET | ORAL | Status: DC | PRN

## 2018-01-07 MED ORDER — FENTANYL 2.5 MCG/ML BUPIVACAINE 1/10 % EPIDURAL INFUSION (WH - ANES)
INTRAMUSCULAR | Status: AC
  Filled 2018-01-07: qty 100

## 2018-01-07 MED ORDER — PRENATAL MULTIVITAMIN CH
1.0000 | ORAL_TABLET | Freq: Every day | ORAL | Status: DC
  Administered 2018-01-07: 1 via ORAL
  Filled 2018-01-07: qty 1

## 2018-01-07 MED ORDER — FENTANYL 2.5 MCG/ML BUPIVACAINE 1/10 % EPIDURAL INFUSION (WH - ANES)
14.0000 mL/h | INTRAMUSCULAR | Status: DC | PRN
  Administered 2018-01-07: 14 mL/h via EPIDURAL

## 2018-01-07 MED ORDER — ZOLPIDEM TARTRATE 5 MG PO TABS: 5.0000 mg | ORAL_TABLET | Freq: Every evening | ORAL | Status: DC | PRN

## 2018-01-07 MED ORDER — LIDOCAINE HCL (PF) 1 % IJ SOLN
INTRAMUSCULAR | Status: DC | PRN
  Administered 2018-01-07 (×2): 5 mL via EPIDURAL

## 2018-01-07 MED ORDER — DIPHENHYDRAMINE HCL 50 MG/ML IJ SOLN: 12.5000 mg | INTRAMUSCULAR | Status: DC | PRN

## 2018-01-07 MED ORDER — BENZOCAINE-MENTHOL 20-0.5 % EX AERO
1.0000 "application " | INHALATION_SPRAY | CUTANEOUS | Status: DC | PRN
  Administered 2018-01-07: 1 via TOPICAL
  Filled 2018-01-07: qty 56

## 2018-01-07 MED ORDER — IBUPROFEN 600 MG PO TABS
600.0000 mg | ORAL_TABLET | Freq: Four times a day (QID) | ORAL | Status: DC
  Administered 2018-01-07 – 2018-01-08 (×4): 600 mg via ORAL
  Filled 2018-01-07 (×4): qty 1

## 2018-01-07 MED ORDER — PHENYLEPHRINE 40 MCG/ML (10ML) SYRINGE FOR IV PUSH (FOR BLOOD PRESSURE SUPPORT)
PREFILLED_SYRINGE | INTRAVENOUS | Status: AC
  Filled 2018-01-07: qty 20

## 2018-01-07 MED ORDER — WITCH HAZEL-GLYCERIN EX PADS: 1.0000 "application " | MEDICATED_PAD | CUTANEOUS | Status: DC | PRN

## 2018-01-07 MED ORDER — LACTATED RINGERS IV SOLN: 500.0000 mL | Freq: Once | INTRAVENOUS | Status: DC

## 2018-01-07 MED ORDER — MEDROXYPROGESTERONE ACETATE 150 MG/ML IM SUSP: 150.0000 mg | INTRAMUSCULAR | Status: DC | PRN

## 2018-01-07 MED ORDER — ONDANSETRON HCL 4 MG/2ML IJ SOLN: 4.0000 mg | INTRAMUSCULAR | Status: DC | PRN

## 2018-01-07 MED ORDER — DIPHENHYDRAMINE HCL 25 MG PO CAPS: 25.0000 mg | ORAL_CAPSULE | Freq: Four times a day (QID) | ORAL | Status: DC | PRN

## 2018-01-07 MED ORDER — ACETAMINOPHEN 325 MG PO TABS: 650.0000 mg | ORAL_TABLET | ORAL | Status: DC | PRN

## 2018-01-07 MED ORDER — FLEET ENEMA 7-19 GM/118ML RE ENEM: 1.0000 | ENEMA | Freq: Every day | RECTAL | Status: DC | PRN

## 2018-01-07 MED ORDER — ONDANSETRON HCL 4 MG PO TABS: 4.0000 mg | ORAL_TABLET | ORAL | Status: DC | PRN

## 2018-01-07 MED ORDER — BISACODYL 10 MG RE SUPP: 10.0000 mg | Freq: Every day | RECTAL | Status: DC | PRN

## 2018-01-07 MED ORDER — EPHEDRINE 5 MG/ML INJ
10.0000 mg | INTRAVENOUS | Status: DC | PRN
Start: 2018-01-07 — End: 2018-01-07

## 2018-01-07 MED ORDER — DIBUCAINE 1 % RE OINT: 1.0000 "application " | TOPICAL_OINTMENT | RECTAL | Status: DC | PRN

## 2018-01-07 MED ORDER — EPHEDRINE 5 MG/ML INJ: 10.0000 mg | INTRAVENOUS | Status: DC | PRN

## 2018-01-07 NOTE — Anesthesia Procedure Notes (Signed)
Epidural Patient location during procedure: OB Start time: 01/07/2018 12:32 AM End time: 01/07/2018 12:43 AM  Staffing Anesthesiologist: Heather RobertsSinger, Lorisa Scheid, MD Performed: anesthesiologist   Preanesthetic Checklist Completed: patient identified, site marked, pre-op evaluation, timeout performed, IV checked, risks and benefits discussed and monitors and equipment checked  Epidural Patient position: sitting Prep: DuraPrep Patient monitoring: heart rate, cardiac monitor, continuous pulse ox and blood pressure Approach: midline Location: L2-L3 Injection technique: LOR saline  Needle:  Needle type: Tuohy  Needle gauge: 17 G Needle length: 9 cm Needle insertion depth: 4 cm Catheter size: 20 Guage Catheter at skin depth: 9 cm Test dose: negative and Other  Assessment Events: blood not aspirated, injection not painful, no injection resistance and negative IV test  Additional Notes Informed consent obtained prior to proceeding including risk of failure, 1% risk of PDPH, risk of minor discomfort and bruising.  Discussed rare but serious complications including epidural abscess, permanent nerve injury, epidural hematoma.  Discussed alternatives to epidural analgesia and patient desires to proceed.  Timeout performed pre-procedure verifying patient name, procedure, and platelet count.  Patient tolerated procedure well.

## 2018-01-07 NOTE — Anesthesia Postprocedure Evaluation (Signed)
Anesthesia Post Note  Patient: Susan Lynn  Procedure(s) Performed: AN AD HOC LABOR EPIDURAL     Patient location during evaluation: Mother Baby Anesthesia Type: Epidural Level of consciousness: awake Pain management: satisfactory to patient Vital Signs Assessment: post-procedure vital signs reviewed and stable Respiratory status: spontaneous breathing Cardiovascular status: stable Anesthetic complications: no    Last Vitals:  Vitals:   01/07/18 0927 01/07/18 1403  BP: (!) 102/57 (!) 104/58  Pulse: 65 (!) 58  Resp: 18 16  Temp: 36.8 C 36.9 C  SpO2:  99%    Last Pain:  Vitals:   01/07/18 1403  TempSrc: Oral  PainSc:    Pain Goal: Patients Stated Pain Goal: 7 (01/06/18 2300)               Cephus ShellingBURGER,Shakyra Mattera

## 2018-01-07 NOTE — H&P (Signed)
Susan Lynn is a 32 y.o.G 2 P 1 at 40 w 1 day presents in labor.  Now status post epidural OB History    Gravida Para Term Preterm AB Living   2 1 1     1    SAB TAB Ectopic Multiple Live Births         0 1     Past Medical History:  Diagnosis Date  . Anemia    Past Surgical History:  Procedure Laterality Date  . MYRINGOTOMY    . NO PAST SURGERIES     Family History: family history includes Breast cancer in her maternal grandmother; Heart disease in her maternal grandfather; Hypertension in her father. Social History:  reports that  has never smoked. she has never used smokeless tobacco. She reports that she does not drink alcohol or use drugs.     Maternal Diabetes: No Genetic Screening: Normal Maternal Ultrasounds/Referrals: Normal Fetal Ultrasounds or other Referrals:  None Maternal Substance Abuse:  No Significant Maternal Medications:  None Significant Maternal Lab Results:  None Other Comments:  None  Review of Systems  All other systems reviewed and are negative.  Maternal Medical History:  Reason for admission: Rupture of membranes and contractions.     Dilation: 10 Effacement (%): 100 Station: Plus 2 Exam by:: Dr. Vincente Poligrewal Blood pressure 105/69, pulse 82, temperature 98 F (36.7 C), temperature source Oral, resp. rate 18, height 5\' 6"  (1.676 m), weight 68.9 kg (152 lb), last menstrual period 04/01/2017, SpO2 100 %, unknown if currently breastfeeding. Maternal Exam:  Uterine Assessment: Contraction strength is moderate.  Contraction frequency is regular.   Abdomen: Fetal presentation: vertex     Fetal Exam Fetal State Assessment: Category I - tracings are normal.     Physical Exam  Nursing note and vitals reviewed. Constitutional: She appears well-developed and well-nourished.  HENT:  Head: Normocephalic.  Eyes: Pupils are equal, round, and reactive to light.  Neck: Normal range of motion.  Cardiovascular: Normal rate and regular rhythm.   Respiratory: Effort normal.  GI: Soft.    Prenatal labs: ABO, Rh: --/--/A POS (03/11 2235) Antibody: NEG (03/11 2235) Rubella: Immune (07/30 0000) RPR: Nonreactive (07/30 0000)  HBsAg: Negative (07/30 0000)  HIV: Non-reactive (07/30 0000)  GBS: Negative (02/04 0000)   Assessment/Plan: IUP at 40 w 1 day  Labor   Susan Lynn 01/07/2018, 5:38 AM

## 2018-01-07 NOTE — Plan of Care (Signed)
Progressing appropriately. Encourage to call for assistance as needed with feeding, and for [facility] assessment.

## 2018-01-07 NOTE — Anesthesia Preprocedure Evaluation (Signed)
Anesthesia Evaluation  Patient identified by MRN, date of birth, ID band Patient awake    Reviewed: Allergy & Precautions, NPO status , Patient's Chart, lab work & pertinent test results  History of Anesthesia Complications Negative for: history of anesthetic complications  Airway Mallampati: II  TM Distance: >3 FB Neck ROM: Full    Dental no notable dental hx. (+) Dental Advisory Given   Pulmonary neg pulmonary ROS, neg sleep apnea,    Pulmonary exam normal breath sounds clear to auscultation       Cardiovascular (-) hypertensionnegative cardio ROS   Rhythm:Regular Rate:Normal     Neuro/Psych negative neurological ROS  negative psych ROS   GI/Hepatic negative GI ROS, Neg liver ROS,   Endo/Other  negative endocrine ROS  Renal/GU negative Renal ROS  negative genitourinary   Musculoskeletal negative musculoskeletal ROS (+)   Abdominal Normal abdominal exam  (+)   Peds negative pediatric ROS (+)  Hematology negative hematology ROS (+)   Anesthesia Other Findings   Reproductive/Obstetrics negative OB ROS                             Anesthesia Physical  Anesthesia Plan  ASA: II  Anesthesia Plan: Epidural   Post-op Pain Management:    Induction:   PONV Risk Score and Plan:   Airway Management Planned: Natural Airway  Additional Equipment:   Intra-op Plan:   Post-operative Plan:   Informed Consent: I have reviewed the patients History and Physical, chart, labs and discussed the procedure including the risks, benefits and alternatives for the proposed anesthesia with the patient or authorized representative who has indicated his/her understanding and acceptance.   Dental advisory given  Plan Discussed with: Anesthesiologist  Anesthesia Plan Comments: ( )        Anesthesia Quick Evaluation

## 2018-01-08 LAB — CBC
HCT: 36.4 % (ref 36.0–46.0)
Hemoglobin: 12.3 g/dL (ref 12.0–15.0)
MCH: 30.5 pg (ref 26.0–34.0)
MCHC: 33.8 g/dL (ref 30.0–36.0)
MCV: 90.3 fL (ref 78.0–100.0)
Platelets: 118 10*3/uL — ABNORMAL LOW (ref 150–400)
RBC: 4.03 MIL/uL (ref 3.87–5.11)
RDW: 14.6 % (ref 11.5–15.5)
WBC: 9.8 10*3/uL (ref 4.0–10.5)

## 2018-01-08 NOTE — Lactation Note (Signed)
This note was copied from a baby's chart. Lactation Consultation Note Baby 23 hrs old. Mom states baby BF well. Baby cluster last evening, had a sleepy stretch. Baby has a bruise to his head. Discussed jaundice. Baby has had 6 stools and 1 void. Baby will be circumcised before leaving. Parents wants Early discharge today. Mom BF her 1 st child now 89 yrs old for 1 yr. Mom has to wear NS the entire time BF. Mom stated Her Lt. Nipple was inverted, appears to be everted at this time. Mom states nipple goes in a little when compressed. Shells given to war in bra, hand pump given to pre-pump before latching. Encouraged mom to call for assist if needed. Mom stated she has NS at home in case she needs one. Newborn behavior, STS, I&O, cluster feeding, breast massage, filling, engorgement, management, jaundice, and nipple care. Mom is a little sore from baby cluster feeding. Comfort gels given.  Mom encouraged to feed baby 8-12 times/24 hours and with feeding cues.  Encouraged mom to call for assistance if needed.  [facility] brochure given w/resources, support groups and LC services.  Patient Name: Susan Lynn ZOXWR'UToday's Date: 01/08/2018 Reason for consult: Initial assessment   Maternal Data Has patient been taught Hand Expression?: Yes Does the patient have breastfeeding experience prior to this delivery?: Yes  Feeding Feeding Type: Breast Fed  LATCH Score Latch: Repeated attempts needed to sustain latch, nipple held in mouth throughout feeding, stimulation needed to elicit sucking reflex.  Audible Swallowing: A few with stimulation  Type of Nipple: Everted at rest and after stimulation  Comfort (Breast/Nipple): Filling, red/small blisters or bruises, mild/mod discomfort  Hold (Positioning): Assistance needed to correctly position infant at breast and maintain latch.  LATCH Score: 7  Interventions Interventions: Breast feeding basics reviewed;Position options;Support pillows;Breast  massage;Hand express;Shells;Pre-pump if needed;Comfort gels;Hand pump;Breast compression  Lactation Tools Discussed/Used Tools: Shells;Pump;Comfort gels Shell Type: Inverted Breast pump type: Manual Pump Review: Setup, frequency, and cleaning;Milk Storage Initiated by:: Peri JeffersonL. Amee Boothe RN IBCLC Date initiated:: 01/08/18   Consult Status Consult Status: Complete Date: 01/08/18    Susan Lynn, [name] 01/08/2018, 4:30 AM

## 2018-01-08 NOTE — Discharge Summary (Signed)
Obstetric Discharge Summary Reason for Admission: onset of labor Prenatal Procedures: none Intrapartum Procedures: vacuum Postpartum Procedures: none Complications-Operative and Postpartum: none Hemoglobin  Date Value Ref Range Status  01/06/2018 13.9 12.0 - 15.0 g/dL Final   HCT  Date Value Ref Range Status  01/06/2018 40.4 36.0 - 46.0 % Final    Physical Exam:  General: alert, cooperative and appears stated age Lochia: appropriate Uterine Fundus: firm Incision: healing well DVT Evaluation: No evidence of DVT seen on physical exam. Negative Homan's sign. No cords or calf tenderness. No significant calf/ankle edema.  Discharge Diagnoses: Term Pregnancy-delivered  Discharge Information: Date: 01/08/2018 Activity: pelvic rest Diet: routine Medications: None Condition: stable Instructions: refer to practice specific booklet Discharge to: home   Newborn Data: Live born female  Birth Weight: 8 lb 3.9 oz (3739 g) APGAR: 8, 9  Newborn Delivery   Birth date/time:  01/07/2018 05:24:00 Delivery type:  Vaginal, Vacuum (Extractor)     Home with mother.  Susan Lynn Susan Lynn Susan Lynn 01/08/2018, 8:51 AM

## 2018-08-11 ENCOUNTER — Other Ambulatory Visit: Payer: Self-pay | Admitting: Radiology

## 2018-08-11 DIAGNOSIS — N6310 Unspecified lump in the right breast, unspecified quadrant: Secondary | ICD-10-CM

## 2018-08-15 ENCOUNTER — Ambulatory Visit
Admission: RE | Admit: 2018-08-15 | Discharge: 2018-08-15 | Disposition: A | Discharge status: Home / Self Care | Payer: BLUE CROSS/BLUE SHIELD | Source: Ambulatory Visit | Attending: Radiology | Admitting: Radiology

## 2018-08-15 ENCOUNTER — Other Ambulatory Visit: Payer: Self-pay | Admitting: Radiology

## 2018-08-15 DIAGNOSIS — N6310 Unspecified lump in the right breast, unspecified quadrant: Secondary | ICD-10-CM

## 2018-08-22 ENCOUNTER — Other Ambulatory Visit: Payer: Self-pay | Admitting: Radiology

## 2018-08-22 ENCOUNTER — Ambulatory Visit
Admission: RE | Admit: 2018-08-22 | Discharge: 2018-08-22 | Disposition: A | Discharge status: Home / Self Care | Payer: BLUE CROSS/BLUE SHIELD | Source: Ambulatory Visit | Attending: Radiology | Admitting: Radiology

## 2018-08-22 DIAGNOSIS — N6310 Unspecified lump in the right breast, unspecified quadrant: Secondary | ICD-10-CM

## 2019-02-16 ENCOUNTER — Inpatient Hospital Stay
Admission: RE | Admit: 2019-02-16 | Discharge: 2019-02-16 | Disposition: A | Discharge status: Home / Self Care | Payer: BLUE CROSS/BLUE SHIELD | Source: Ambulatory Visit | Attending: Radiology | Admitting: Radiology

## 2020-01-17 IMAGING — US ULTRASOUND RIGHT BREAST LIMITED
1 series · 8 of 8 positions shown · non-contrast
Comparison: None.

ADDENDUM:
Upon reviewing the patient's images it is felt that the mass in the
1 o'clock region of the right breast 7 cm from the nipple may be a
benign lesion but malignancy cannot entirely be excluded. Therefore,
I am recommending ultrasound-guided core biopsy of the mass at 1
o'clock 7 cm from the nipple. At the time of biopsy sonographic
evaluation of the right axilla should be performed. Results were
called to the patient and the biopsy will be scheduled.

Recommendation: Ultrasound-guided core biopsy of the right breast.
BI-RADS category:  4: Suspicious.
CLINICAL DATA: 32-year-old female complaining of a palpable right
breast mass. Patient is currently breast feeding.
EXAM:
DIGITAL DIAGNOSTIC BILATERAL MAMMOGRAM WITH CAD AND TOMO
ULTRASOUND RIGHT BREAST

[Series 1: ultrasound right breast limited · 0.05mm/px · 8 of 8 slices shown]
[im 1/8]
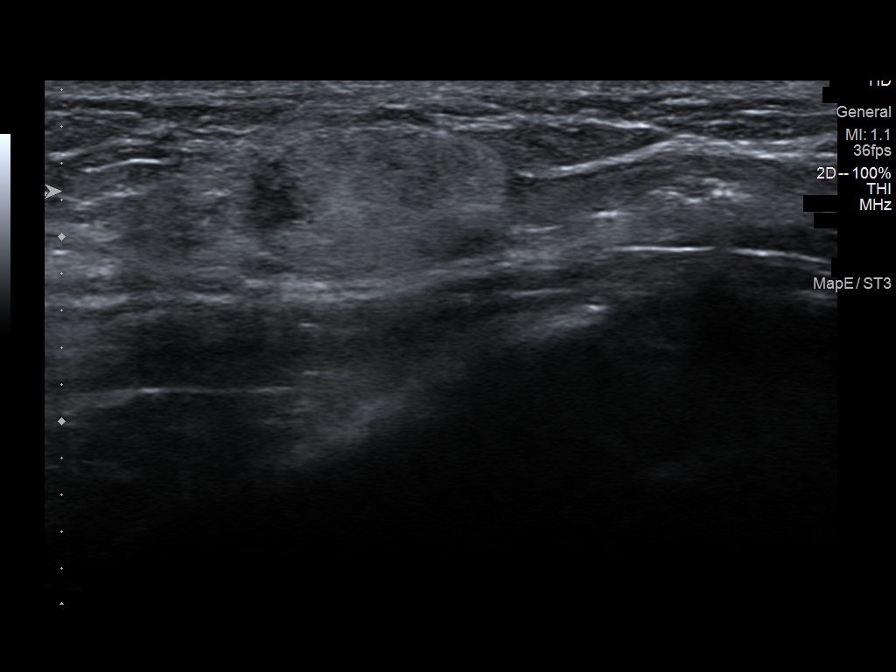
[im 2/8]
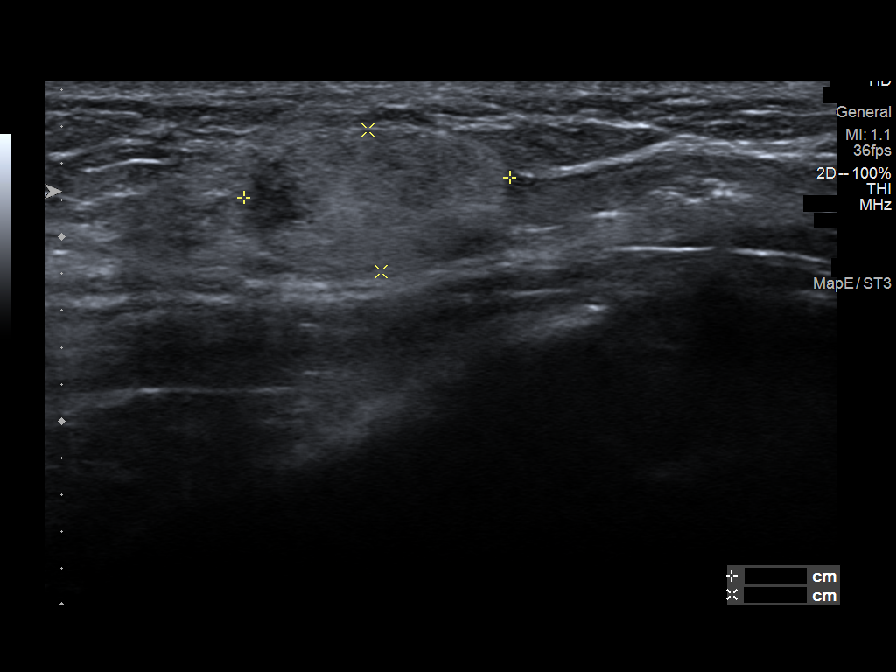
[im 3/8]
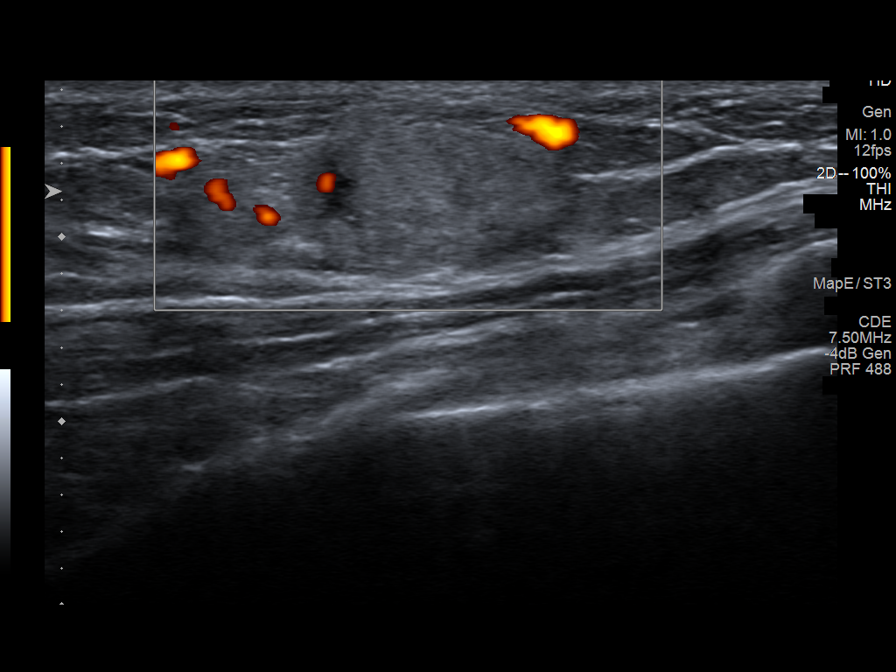
[im 4/8]
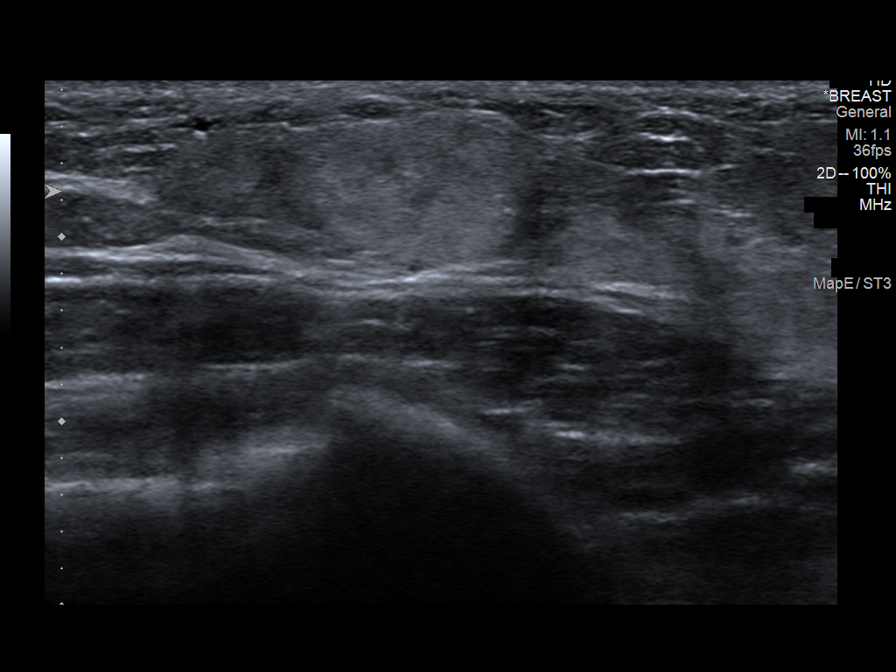
[im 5/8]
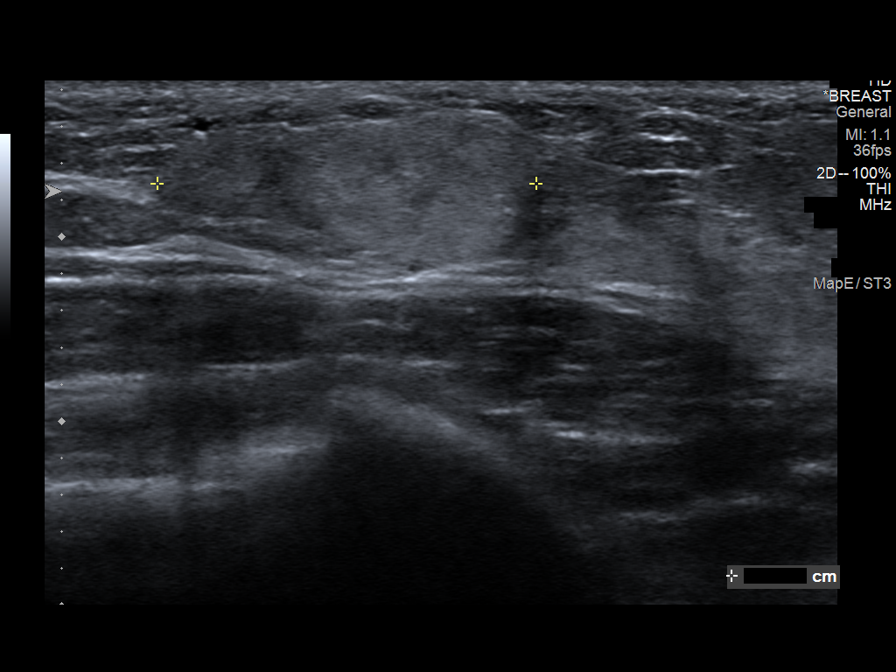
[im 6/8]
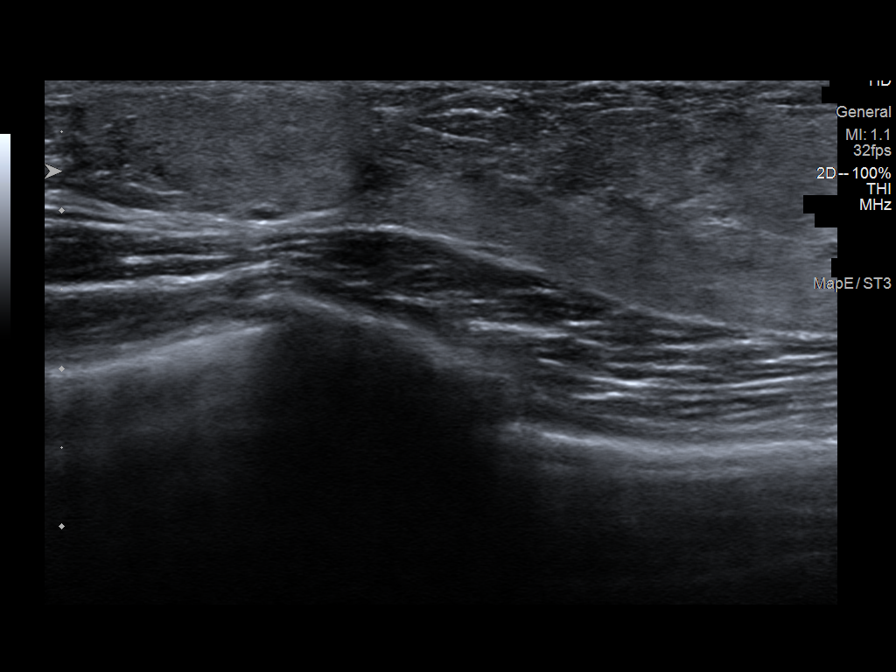
[im 7/8]
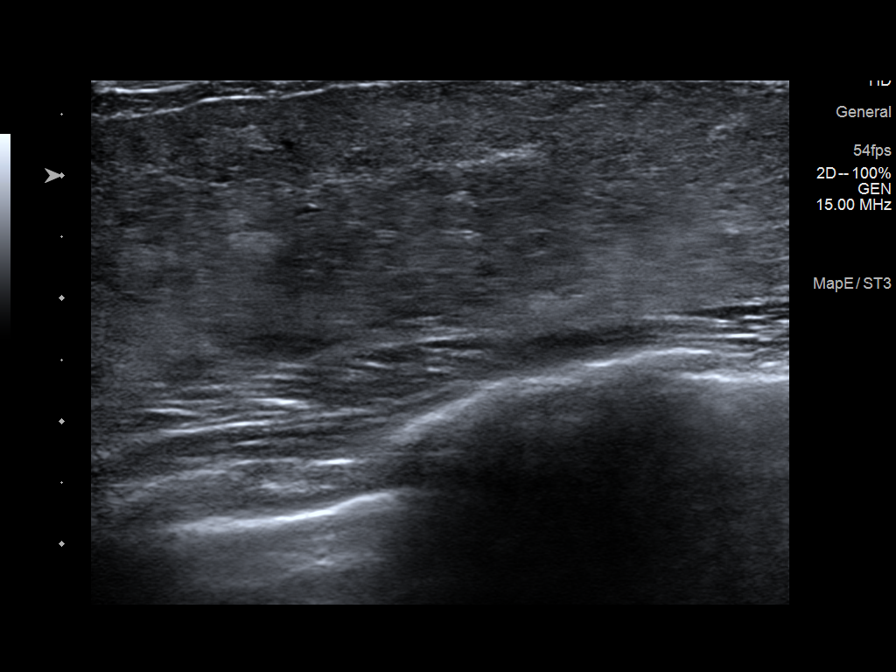
[im 8/8]
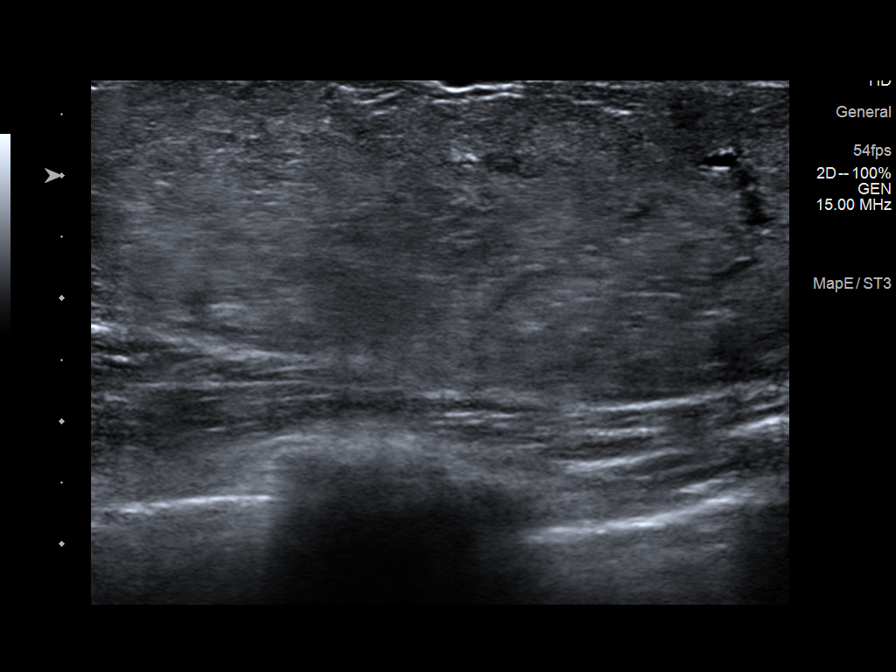

[8 of 8 positions shown; findings below may reference images not displayed]

ACR Breast Density Category d: The breast tissue is extremely dense,
which lowers the sensitivity of mammography.
FINDINGS: No suspicious mass, malignant type microcalcifications or distortion
detected in either breast. Spot tangential view the area of clinical
concern in the right breast shows normal fibroglandular tissue.

Mammographic images were processed with CAD.

On physical exam, I palpate soft thickening in the right breast at 1
o'clock 7 cm from the nipple.

Targeted ultrasound is performed, showing an isoechoic,
well-circumscribed lesion in the right breast at 1 o'clock 7 cm from
the nipple measuring 1.5 x 0.8 x 2.1 cm. It has a benign appearance
and may be a lactating adenoma.
IMPRESSION: Probable benign mass in the right breast.

RECOMMENDATION:
Short-term interval follow-up right breast ultrasound in 6 months is
recommended. The importance of self-breast examination was discussed
with the patient.

I have discussed the findings and recommendations with the patient.
Results were also provided in writing at the conclusion of the
visit. If applicable, a reminder letter will be sent to the patient
regarding the next appointment.

BI-RADS CATEGORY  3: Probably benign.

## 2020-01-17 IMAGING — MG DIGITAL DIAGNOSTIC BILATERAL MAMMOGRAM WITH TOMO AND CAD
6 of 10 series · 6 of 30 positions shown · non-contrast
Comparison: None.

ADDENDUM:
Upon reviewing the patient's images it is felt that the mass in the
1 o'clock region of the right breast 7 cm from the nipple may be a
benign lesion but malignancy cannot entirely be excluded. Therefore,
I am recommending ultrasound-guided core biopsy of the mass at 1
o'clock 7 cm from the nipple. At the time of biopsy sonographic
evaluation of the right axilla should be performed. Results were
called to the patient and the biopsy will be scheduled.

Recommendation: Ultrasound-guided core biopsy of the right breast.
BI-RADS category:  4: Suspicious.
CLINICAL DATA: 32-year-old female complaining of a palpable right
breast mass. Patient is currently breast feeding.
EXAM:
DIGITAL DIAGNOSTIC BILATERAL MAMMOGRAM WITH CAD AND TOMO
ULTRASOUND RIGHT BREAST

[R MLO synth-2D]
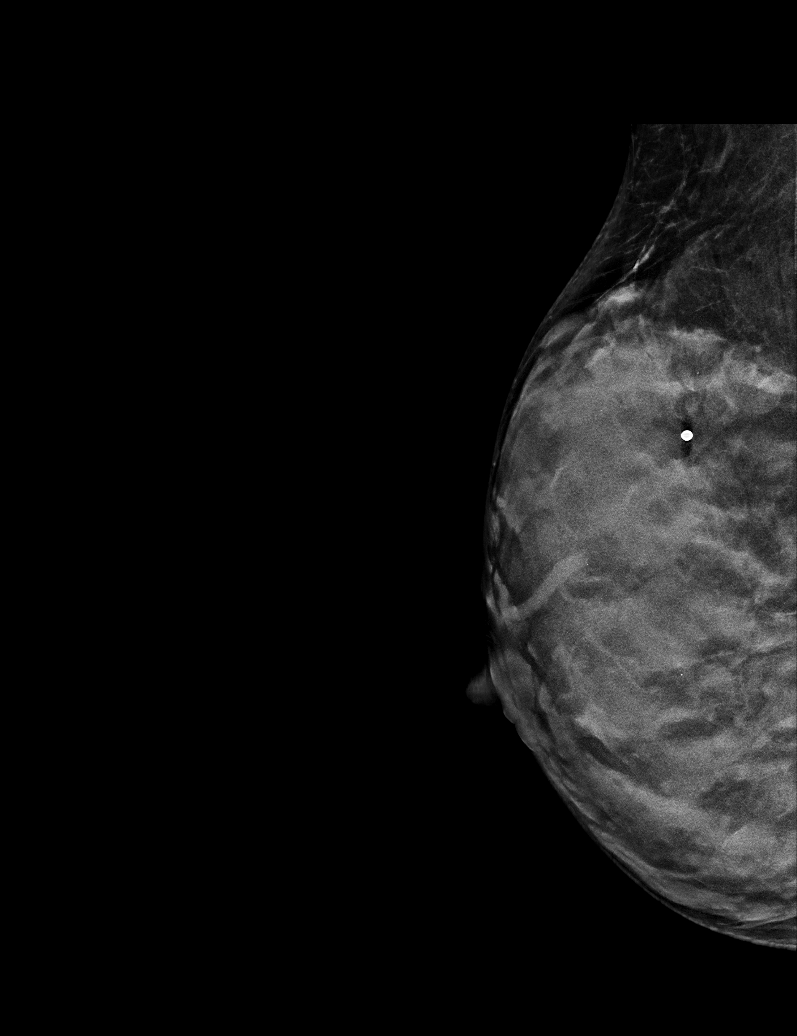

[R TAN synth-2D]
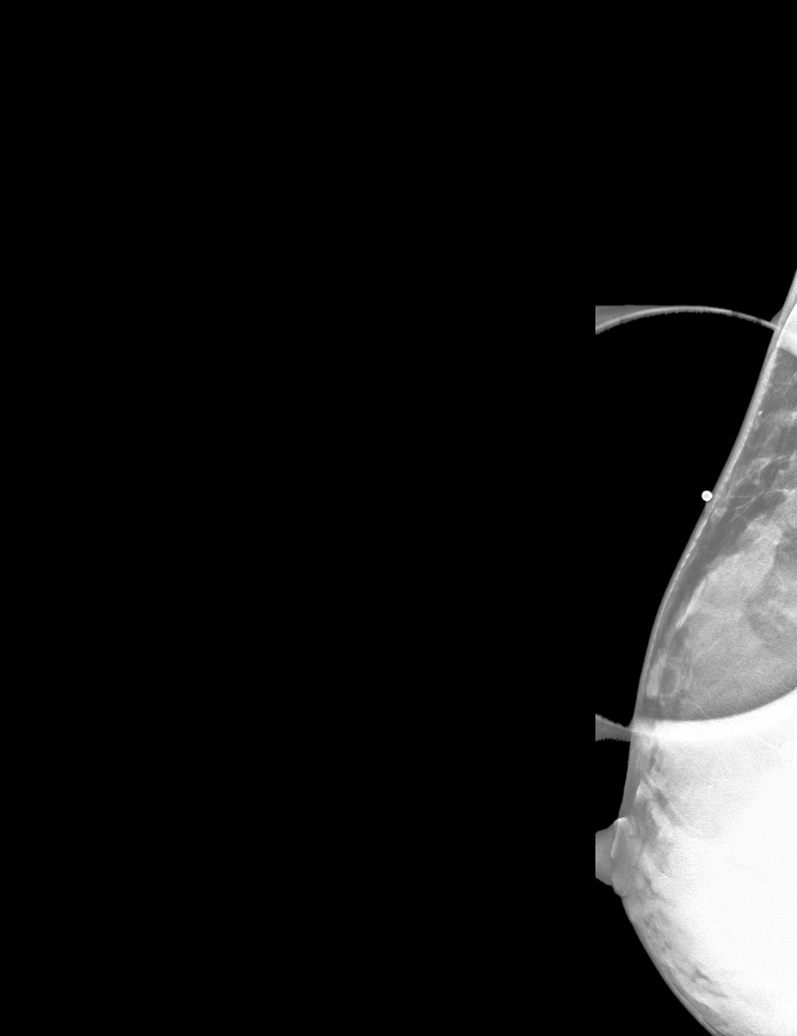

[L CC synth-2D]
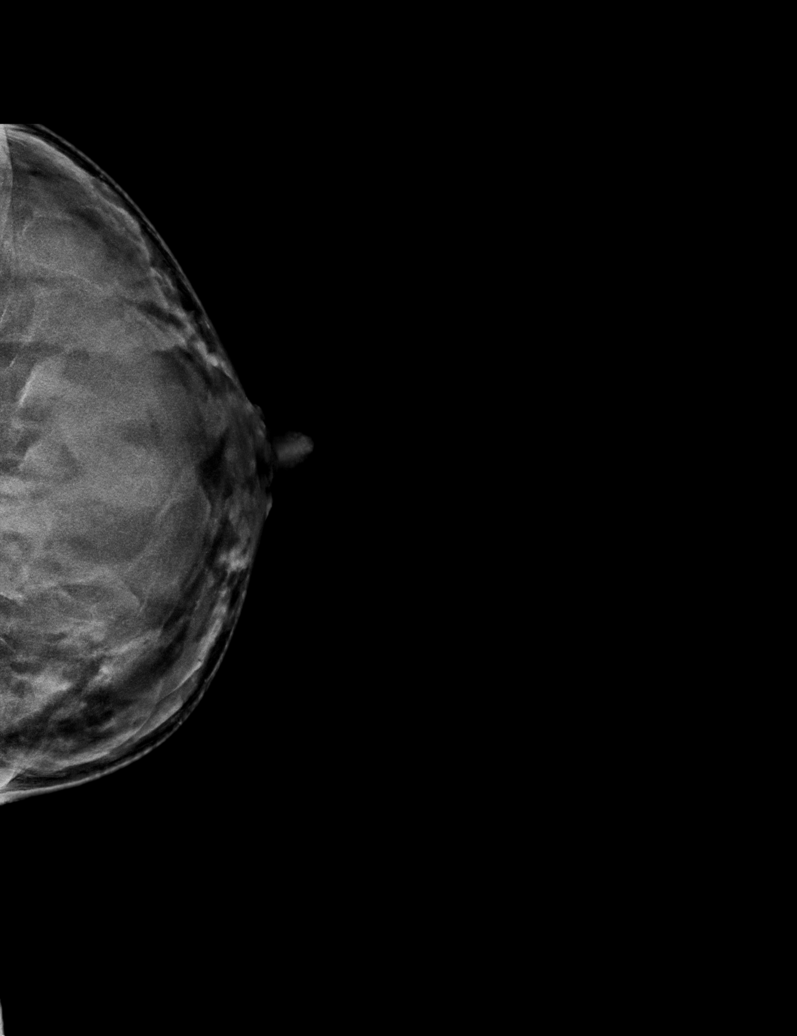

[R CC synth-2D]
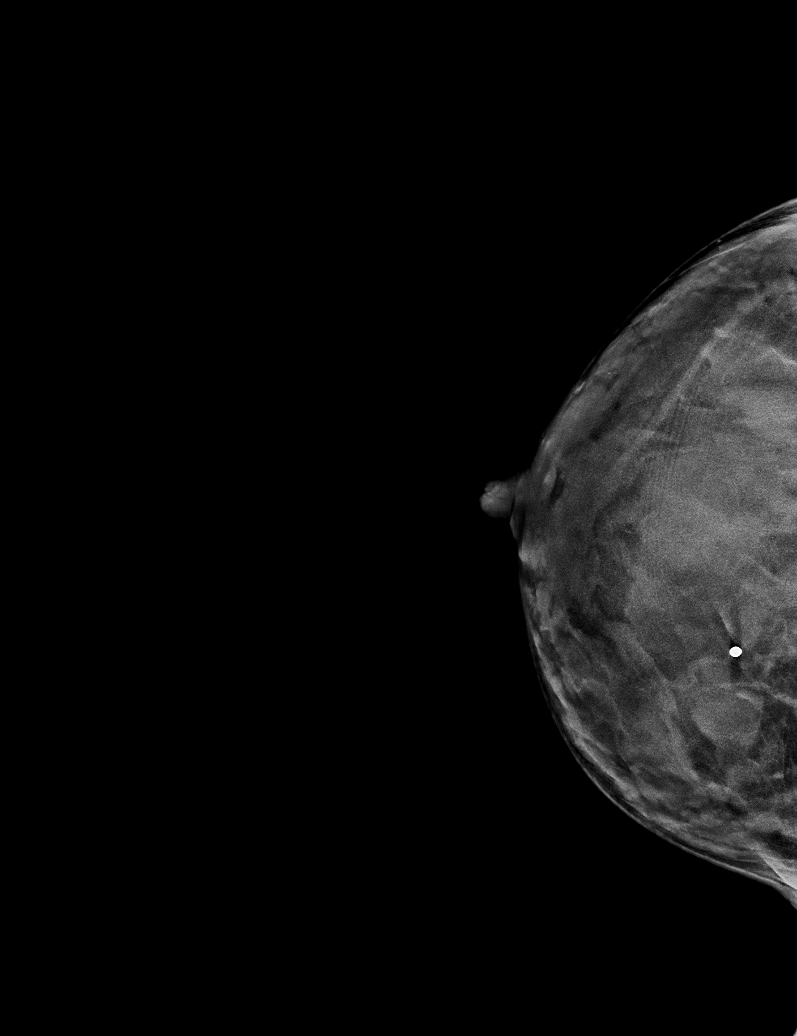

[L MLO synth-2D]
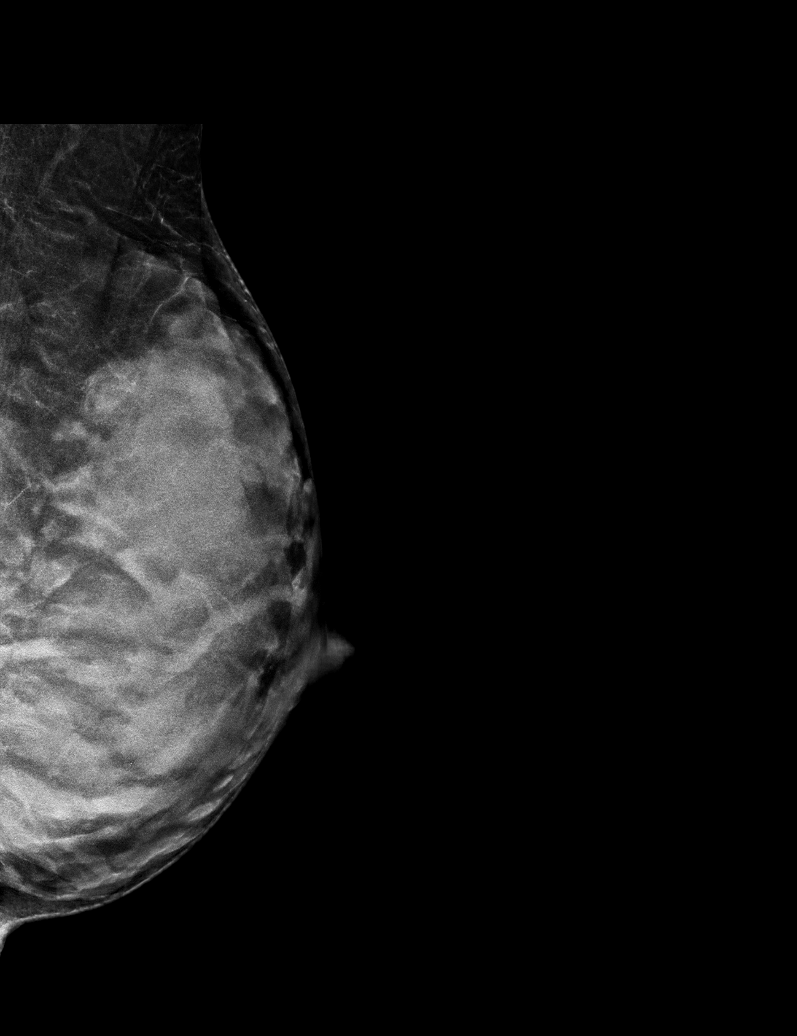

[R CC tomo · tomo slice 29/57.0]
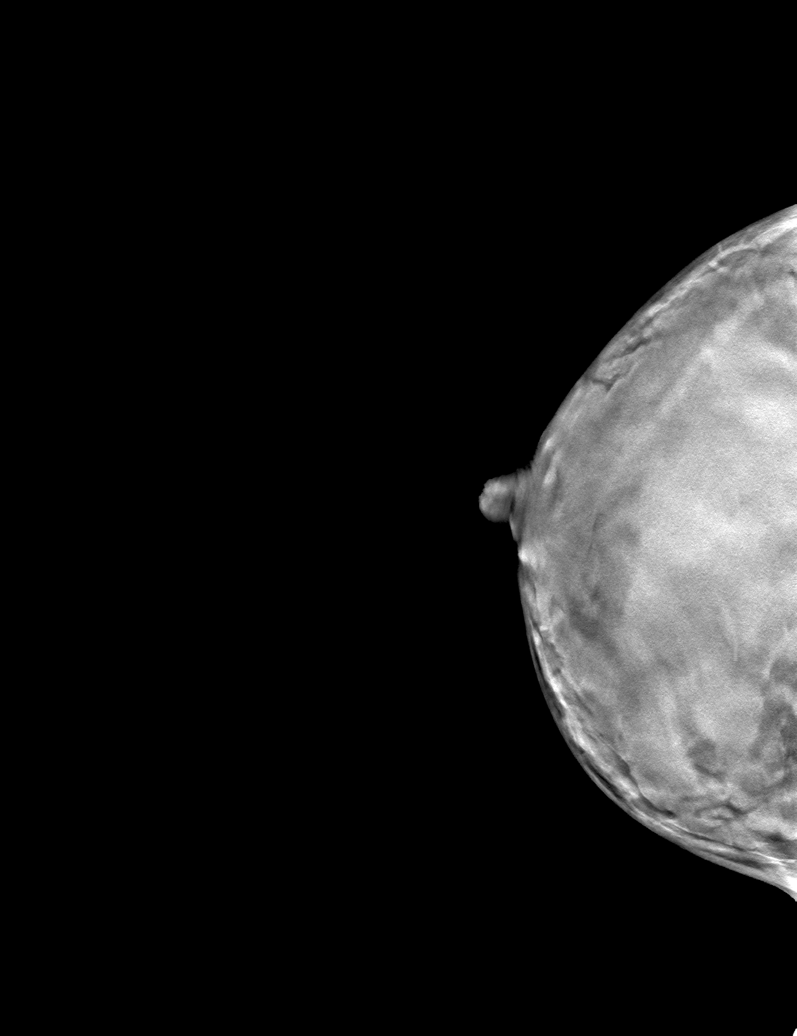

[6 of 30 positions shown; findings below may reference images not displayed]

ACR Breast Density Category d: The breast tissue is extremely dense,
which lowers the sensitivity of mammography.
FINDINGS: No suspicious mass, malignant type microcalcifications or distortion
detected in either breast. Spot tangential view the area of clinical
concern in the right breast shows normal fibroglandular tissue.

Mammographic images were processed with CAD.

On physical exam, I palpate soft thickening in the right breast at 1
o'clock 7 cm from the nipple.

Targeted ultrasound is performed, showing an isoechoic,
well-circumscribed lesion in the right breast at 1 o'clock 7 cm from
the nipple measuring 1.5 x 0.8 x 2.1 cm. It has a benign appearance
and may be a lactating adenoma.
IMPRESSION: Probable benign mass in the right breast.

RECOMMENDATION:
Short-term interval follow-up right breast ultrasound in 6 months is
recommended. The importance of self-breast examination was discussed
with the patient.

I have discussed the findings and recommendations with the patient.
Results were also provided in writing at the conclusion of the
visit. If applicable, a reminder letter will be sent to the patient
regarding the next appointment.

BI-RADS CATEGORY  3: Probably benign.
